# Patient Record
Sex: Female | Born: 1970 | Race: Black or African American | Hispanic: No | Marital: Single | Smoking: Never smoker
Health system: Southern US, Community
[De-identification: ages and names within clinical notes are randomized; demographics above are authoritative.]

## PROBLEM LIST (undated history)

## (undated) DIAGNOSIS — R011 Cardiac murmur, unspecified: Secondary | ICD-10-CM

## (undated) DIAGNOSIS — E041 Nontoxic single thyroid nodule: Secondary | ICD-10-CM

## (undated) DIAGNOSIS — A6000 Herpesviral infection of urogenital system, unspecified: Secondary | ICD-10-CM

## (undated) DIAGNOSIS — G43909 Migraine, unspecified, not intractable, without status migrainosus: Secondary | ICD-10-CM

## (undated) DIAGNOSIS — M1990 Unspecified osteoarthritis, unspecified site: Secondary | ICD-10-CM

## (undated) DIAGNOSIS — I272 Pulmonary hypertension, unspecified: Secondary | ICD-10-CM

## (undated) DIAGNOSIS — B9689 Other specified bacterial agents as the cause of diseases classified elsewhere: Secondary | ICD-10-CM

## (undated) DIAGNOSIS — N760 Acute vaginitis: Secondary | ICD-10-CM

## (undated) HISTORY — DX: Herpesviral infection of urogenital system, unspecified: A60.00

## (undated) HISTORY — DX: Nontoxic single thyroid nodule: E04.1

## (undated) HISTORY — DX: Migraine, unspecified, not intractable, without status migrainosus: G43.909

## (undated) HISTORY — DX: Cardiac murmur, unspecified: R01.1

## (undated) HISTORY — DX: Acute vaginitis: N76.0

## (undated) HISTORY — PX: TONSILLECTOMY: SUR1361

## (undated) HISTORY — DX: Other specified bacterial agents as the cause of diseases classified elsewhere: B96.89

---

## 2009-03-03 ENCOUNTER — Emergency Department (HOSPITAL_BASED_OUTPATIENT_CLINIC_OR_DEPARTMENT_OTHER): Admission: EM | Admit: 2009-03-03 | Discharge: 2009-03-03 | Payer: Self-pay | Admitting: Emergency Medicine

## 2009-03-03 ENCOUNTER — Ambulatory Visit: Payer: Self-pay | Admitting: Diagnostic Radiology

## 2009-05-01 ENCOUNTER — Ambulatory Visit: Payer: Self-pay | Admitting: Diagnostic Radiology

## 2009-05-01 ENCOUNTER — Emergency Department (HOSPITAL_BASED_OUTPATIENT_CLINIC_OR_DEPARTMENT_OTHER): Admission: EM | Admit: 2009-05-01 | Discharge: 2009-05-01 | Payer: Self-pay | Admitting: Emergency Medicine

## 2011-01-23 LAB — CBC
HCT: 40.2 % (ref 36.0–46.0)
Hemoglobin: 13.6 g/dL (ref 12.0–15.0)
RDW: 12.4 % (ref 11.5–15.5)

## 2011-01-23 LAB — DIFFERENTIAL
Basophils Absolute: 0 10*3/uL (ref 0.0–0.1)
Eosinophils Relative: 1 % (ref 0–5)
Lymphocytes Relative: 23 % (ref 12–46)
Lymphs Abs: 2.2 10*3/uL (ref 0.7–4.0)
Monocytes Absolute: 0.5 10*3/uL (ref 0.1–1.0)

## 2011-01-23 LAB — BASIC METABOLIC PANEL
GFR calc non Af Amer: >60 mL/min (ref >60)
Glucose, Bld: 77 mg/dL (ref 70–99)
Potassium: 4 mEq/L (ref 3.5–5.1)
Sodium: 142 mEq/L (ref 135–145)

## 2011-01-23 LAB — POCT CARDIAC MARKERS
CKMB, poc: <1 ng/mL — ABNORMAL LOW (ref 1.0–8.0)
Myoglobin, poc: 43 ng/mL (ref 12–200)
Troponin i, poc: <0.05 ng/mL (ref 0.00–0.09)
Troponin i, poc: <0.05 ng/mL (ref 0.00–0.09)

## 2013-03-31 ENCOUNTER — Ambulatory Visit (INDEPENDENT_AMBULATORY_CARE_PROVIDER_SITE_OTHER): Payer: Private Health Insurance - Indemnity | Admitting: Family Medicine

## 2013-03-31 ENCOUNTER — Encounter: Payer: Self-pay | Admitting: Family Medicine

## 2013-03-31 VITALS — BP 119/77 | HR 75 | Ht 68.25 in | Wt 157.0 lb

## 2013-03-31 DIAGNOSIS — A6000 Herpesviral infection of urogenital system, unspecified: Secondary | ICD-10-CM

## 2013-03-31 DIAGNOSIS — Z30011 Encounter for initial prescription of contraceptive pills: Secondary | ICD-10-CM

## 2013-03-31 DIAGNOSIS — Z0000 Encounter for general adult medical examination without abnormal findings: Secondary | ICD-10-CM

## 2013-03-31 DIAGNOSIS — Z124 Encounter for screening for malignant neoplasm of cervix: Secondary | ICD-10-CM

## 2013-03-31 DIAGNOSIS — Z3009 Encounter for other general counseling and advice on contraception: Secondary | ICD-10-CM

## 2013-03-31 LAB — POCT URINALYSIS DIPSTICK
Bilirubin, UA: NEGATIVE
Glucose, UA: NEGATIVE
Ketones, UA: NEGATIVE
Leukocytes, UA: NEGATIVE
Nitrite, UA: NEGATIVE
Protein, UA: NEGATIVE
Spec Grav, UA: 1.025
Urobilinogen, UA: NEGATIVE
pH, UA: 5

## 2013-03-31 MED ORDER — NORETHINDRONE ACET-ETHINYL EST 1-20 MG-MCG PO TABS: 1.0000 | ORAL_TABLET | Freq: Every day | ORAL | Status: DC

## 2013-03-31 MED ORDER — VALACYCLOVIR HCL 500 MG PO TABS: 500.0000 mg | ORAL_TABLET | Freq: Every day | ORAL | Status: AC

## 2013-03-31 NOTE — Progress Notes (Signed)
Subjective:    Patient ID: Anna Fleming, female    DOB: 10/20/1970, 42 y.o.   MRN: [id number]  HPI  Anna Fleming is here today for her annual CPE with PAP.  She has done well since her last office visit.  She needs to have her oral contraceptive pills refilled.     Review of Systems  Constitutional: Negative for activity change, appetite change, fatigue and unexpected weight change.  HENT: Negative for hearing loss, ear pain, congestion, trouble swallowing, neck pain, dental problem and voice change.   Eyes: Negative for pain, redness and visual disturbance.  Respiratory: Negative for cough and shortness of breath.   Cardiovascular: Negative for chest pain, palpitations and leg swelling.  Gastrointestinal: Negative for nausea, vomiting, abdominal pain, diarrhea, constipation and blood in stool.  Endocrine: Negative for cold intolerance, heat intolerance, polydipsia, polyphagia and polyuria.  Genitourinary: Negative for dysuria, urgency, frequency, hematuria, vaginal discharge and pelvic pain.  Musculoskeletal: Negative for myalgias, back pain, joint swelling and arthralgias.  Skin: Negative for rash.  Neurological: Negative for dizziness, weakness and headaches.  Hematological: Negative for adenopathy. Does not bruise/bleed easily.  Psychiatric/Behavioral: Negative for sleep disturbance, dysphoric mood and decreased concentration. The patient is not nervous/anxious.     Past Medical History  Diagnosis Date  . Migraine headache   . Bacterial vaginosis   . Cardiac murmur   . Thyroid cyst   . Genital herpes    Family History  Problem Relation Age of Onset  . Hypertension Mother   . Cancer Father   . Stroke Paternal Aunt   . Hypertension Paternal Aunt   . Hypertension Maternal Grandmother    History   Social History Narrative   Marital Status: Single    Children:  Daughters (Destiny, Social worker)   Pets:  None    Living Situation: Lives with her daughters.      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 She appears well-developed and well-nourished.  HENT:  Head: Normocephalic and atraumatic.  Right Ear: External ear normal.  Left Ear: External ear normal.  Nose: Nose normal.  Mouth/Throat: Oropharynx is clear and moist.  Eyes: Conjunctivae and EOM are normal. Pupils are equal, round, and reactive to light.  Neck: Normal range of motion. No thyromegaly present.  Cardiovascular: Normal rate, regular rhythm, normal heart sounds and intact distal pulses.  Exam reveals no gallop and no friction rub.   No murmur heard. Pulmonary/Chest: Effort normal and breath sounds normal. Right breast exhibits no inverted nipple, no mass, no nipple discharge, no skin change and no tenderness. Left breast exhibits no inverted nipple, no mass, no nipple discharge, no skin change and no tenderness. Breasts are symmetrical.  Abdominal: Soft. Bowel sounds are normal. Hernia confirmed negative in the right inguinal area and confirmed negative in the left inguinal area.  Genitourinary: Vagina normal and uterus normal. Pelvic exam was performed with patient supine. There is no rash, tenderness or lesion on the right labia. There is no rash, tenderness or lesion on the left labia. No vaginal discharge  found.  Musculoskeletal: Normal range of motion. She exhibits no edema and no tenderness.  Lymphadenopathy:    She has no cervical adenopathy.       Right: No inguinal adenopathy present.       Left: No inguinal adenopathy present.  Neurological: She is alert and oriented to person, place, and time. She has normal reflexes.  Skin: Skin is warm and dry.  Psychiatric: She has a normal mood and affect.  Her behavior is normal. Judgment and thought content normal.          Assessment & Plan:

## 2013-03-31 NOTE — Patient Instructions (Addendum)
Preventive Care for Adults, Female A healthy lifestyle and preventive care can promote health and wellness. Preventive health guidelines for women include the following key practices.  A routine yearly physical is a good way to check with your caregiver about your health and preventive screening. It is a chance to share any concerns and updates on your health, and to receive a thorough exam.  Visit your dentist for a routine exam and preventive care every 6 months. Brush your teeth twice a day and floss once a day. Good oral hygiene prevents tooth decay and gum disease.  The frequency of eye exams is based on your age, health, family medical history, use of contact lenses, and other factors. Follow your caregiver's recommendations for frequency of eye exams.  Eat a healthy diet. Foods like vegetables, fruits, whole grains, low-fat dairy products, and lean protein foods contain the nutrients you need without too many calories. Decrease your intake of foods high in solid fats, added sugars, and salt. Eat the right amount of calories for you.Get information about a proper diet from your caregiver, if necessary.  Regular physical exercise is one of the most important things you can do for your health. Most adults should get at least 150 minutes of moderate-intensity exercise (any activity that increases your heart rate and causes you to sweat) each week. In addition, most adults need muscle-strengthening exercises on 2 or more days a week.  Maintain a healthy weight. The body mass index (BMI) is a screening tool to identify possible weight problems. It provides an estimate of body fat based on height and weight. Your caregiver can help determine your BMI, and can help you achieve or maintain a healthy weight.For adults 20 years and older:  A BMI below 18.5 is considered underweight.  A BMI of 18.5 to 24.9 is normal.  A BMI of 25 to 29.9 is considered overweight.  A BMI of 30 and above is  considered obese.  Maintain normal blood lipids and cholesterol levels by exercising and minimizing your intake of saturated fat. Eat a balanced diet with plenty of fruit and vegetables. Blood tests for lipids and cholesterol should begin at age 20 and be repeated every 5 years. If your lipid or cholesterol levels are high, you are over 50, or you are at high risk for heart disease, you may need your cholesterol levels checked more frequently.Ongoing high lipid and cholesterol levels should be treated with medicines if diet and exercise are not effective.  If you smoke, find out from your caregiver how to quit. If you do not use tobacco, do not start.  If you are pregnant, do not drink alcohol. If you are breastfeeding, be very cautious about drinking alcohol. If you are not pregnant and choose to drink alcohol, do not exceed 1 drink per day. One drink is considered to be 12 ounces (355 mL) of beer, 5 ounces (148 mL) of wine, or 1.5 ounces (44 mL) of liquor.  Avoid use of street drugs. Do not share needles with anyone. Ask for help if you need support or instructions about stopping the use of drugs.  High blood pressure causes heart disease and increases the risk of stroke. Your blood pressure should be checked at least every 1 to 2 years. Ongoing high blood pressure should be treated with medicines if weight loss and exercise are not effective.  If you are 55 to 40 years old, ask your caregiver if you should take aspirin to prevent strokes.  Diabetes   screening involves taking a blood sample to check your fasting blood sugar level. This should be done once every 3 years, after age 45, if you are within normal weight and without risk factors for diabetes. Testing should be considered at a younger age or be carried out more frequently if you are overweight and have at least 1 risk factor for diabetes.  Breast cancer screening is essential preventive care for women. You should practice "breast  self-awareness." This means understanding the normal appearance and feel of your breasts and may include breast self-examination. Any changes detected, no matter how small, should be reported to a caregiver. Women in their 20s and 30s should have a clinical breast exam (CBE) by a caregiver as part of a regular health exam every 1 to 3 years. After age 40, women should have a CBE every year. Starting at age 40, women should consider having a mammography (breast X-ray test) every year. Women who have a family history of breast cancer should talk to their caregiver about genetic screening. Women at a high risk of breast cancer should talk to their caregivers about having magnetic resonance imaging (MRI) and a mammography every year.  The Pap test is a screening test for cervical cancer. A Pap test can show cell changes on the cervix that might become cervical cancer if left untreated. A Pap test is a procedure in which cells are obtained and examined from the lower end of the uterus (cervix).  Women should have a Pap test starting at age 21.  Between ages 21 and 29, Pap tests should be repeated every 2 years.  Beginning at age 30, you should have a Pap test every 3 years as long as the past 3 Pap tests have been normal.  Some women have medical problems that increase the chance of getting cervical cancer. Talk to your caregiver about these problems. It is especially important to talk to your caregiver if a new problem develops soon after your last Pap test. In these cases, your caregiver may recommend more frequent screening and Pap tests.  The above recommendations are the same for women who have or have not gotten the vaccine for human papillomavirus (HPV).  If you had a hysterectomy for a problem that was not cancer or a condition that could lead to cancer, then you no longer need Pap tests. Even if you no longer need a Pap test, a regular exam is a good idea to make sure no other problems are  starting.  If you are between ages 65 and 70, and you have had normal Pap tests going back 10 years, you no longer need Pap tests. Even if you no longer need a Pap test, a regular exam is a good idea to make sure no other problems are starting.  If you have had past treatment for cervical cancer or a condition that could lead to cancer, you need Pap tests and screening for cancer for at least 20 years after your treatment.  If Pap tests have been discontinued, risk factors (such as a new sexual partner) need to be reassessed to determine if screening should be resumed.  The HPV test is an additional test that may be used for cervical cancer screening. The HPV test looks for the virus that can cause the cell changes on the cervix. The cells collected during the Pap test can be tested for HPV. The HPV test could be used to screen women aged 30 years and older, and should   be used in women of any age who have unclear Pap test results. After the age of 30, women should have HPV testing at the same frequency as a Pap test.  Colorectal cancer can be detected and often prevented. Most routine colorectal cancer screening begins at the age of 50 and continues through age 75. However, your caregiver may recommend screening at an earlier age if you have risk factors for colon cancer. On a yearly basis, your caregiver may provide home test kits to check for hidden blood in the stool. Use of a small camera at the end of a tube, to directly examine the colon (sigmoidoscopy or colonoscopy), can detect the earliest forms of colorectal cancer. Talk to your caregiver about this at age 50, when routine screening begins. Direct examination of the colon should be repeated every 5 to 10 years through age 75, unless early forms of pre-cancerous polyps or small growths are found.  Hepatitis C blood testing is recommended for all people born from 1945 through 1965 and any individual with known risks for hepatitis C.  Practice  safe sex. Use condoms and avoid high-risk sexual practices to reduce the spread of sexually transmitted infections (STIs). STIs include gonorrhea, chlamydia, syphilis, trichomonas, herpes, HPV, and human immunodeficiency virus (HIV). Herpes, HIV, and HPV are viral illnesses that have no cure. They can result in disability, cancer, and death. Sexually active women aged 25 and younger should be checked for chlamydia. Older women with new or multiple partners should also be tested for chlamydia. Testing for other STIs is recommended if you are sexually active and at increased risk.  Osteoporosis is a disease in which the bones lose minerals and strength with aging. This can result in serious bone fractures. The risk of osteoporosis can be identified using a bone density scan. Women ages 65 and over and women at risk for fractures or osteoporosis should discuss screening with their caregivers. Ask your caregiver whether you should take a calcium supplement or vitamin D to reduce the rate of osteoporosis.  Menopause can be associated with physical symptoms and risks. Hormone replacement therapy is available to decrease symptoms and risks. You should talk to your caregiver about whether hormone replacement therapy is right for you.  Use sunscreen with sun protection factor (SPF) of 30 or more. Apply sunscreen liberally and repeatedly throughout the day. You should seek shade when your shadow is shorter than you. Protect yourself by wearing long sleeves, pants, a wide-brimmed hat, and sunglasses year round, whenever you are outdoors.  Once a month, do a whole body skin exam, using a mirror to look at the skin on your back. Notify your caregiver of new moles, moles that have irregular borders, moles that are larger than a pencil eraser, or moles that have changed in shape or color.  Stay current with required immunizations.  Influenza. You need a dose every fall (or winter). The composition of the flu vaccine  changes each year, so being vaccinated once is not enough.  Pneumococcal polysaccharide. You need 1 to 2 doses if you smoke cigarettes or if you have certain chronic medical conditions. You need 1 dose at age 65 (or older) if you have never been vaccinated.  Tetanus, diphtheria, pertussis (Tdap, Td). Get 1 dose of Tdap vaccine if you are younger than age 65, are over 65 and have contact with an infant, are a healthcare worker, are pregnant, or simply want to be protected from whooping cough. After that, you need a Td   booster dose every 10 years. Consult your caregiver if you have not had at least 3 tetanus and diphtheria-containing shots sometime in your life or have a deep or dirty wound.  HPV. You need this vaccine if you are a woman age 26 or younger. The vaccine is given in 3 doses over 6 months.  Measles, mumps, rubella (MMR). You need at least 1 dose of MMR if you were born in 1957 or later. You may also need a second dose.  Meningococcal. If you are age 19 to 21 and a first-year college student living in a residence hall, or have one of several medical conditions, you need to get vaccinated against meningococcal disease. You may also need additional booster doses.  Zoster (shingles). If you are age 60 or older, you should get this vaccine.  Varicella (chickenpox). If you have never had chickenpox or you were vaccinated but received only 1 dose, talk to your caregiver to find out if you need this vaccine.  Hepatitis A. You need this vaccine if you have a specific risk factor for hepatitis A virus infection or you simply wish to be protected from this disease. The vaccine is usually given as 2 doses, 6 to 18 months apart.  Hepatitis B. You need this vaccine if you have a specific risk factor for hepatitis B virus infection or you simply wish to be protected from this disease. The vaccine is given in 3 doses, usually over 6 months. Preventive Services / Frequency Ages 19 to 39  Blood  pressure check.** / Every 1 to 2 years.  Lipid and cholesterol check.** / Every 5 years beginning at age 20.  Clinical breast exam.** / Every 3 years for women in their 20s and 30s.  Pap test.** / Every 2 years from ages 21 through 29. Every 3 years starting at age 30 through age 65 or 70 with a history of 3 consecutive normal Pap tests.  HPV screening.** / Every 3 years from ages 30 through ages 65 to 70 with a history of 3 consecutive normal Pap tests.  Hepatitis C blood test.** / For any individual with known risks for hepatitis C.  Skin self-exam. / Monthly.  Influenza immunization.** / Every year.  Pneumococcal polysaccharide immunization.** / 1 to 2 doses if you smoke cigarettes or if you have certain chronic medical conditions.  Tetanus, diphtheria, pertussis (Tdap, Td) immunization. / A one-time dose of Tdap vaccine. After that, you need a Td booster dose every 10 years.  HPV immunization. / 3 doses over 6 months, if you are 26 and younger.  Measles, mumps, rubella (MMR) immunization. / You need at least 1 dose of MMR if you were born in 1957 or later. You may also need a second dose.  Meningococcal immunization. / 1 dose if you are age 19 to 21 and a first-year college student living in a residence hall, or have one of several medical conditions, you need to get vaccinated against meningococcal disease. You may also need additional booster doses.  Varicella immunization.** / Consult your caregiver.  Hepatitis A immunization.** / Consult your caregiver. 2 doses, 6 to 18 months apart.  Hepatitis B immunization.** / Consult your caregiver. 3 doses usually over 6 months. Ages 40 to 64  Blood pressure check.** / Every 1 to 2 years.  Lipid and cholesterol check.** / Every 5 years beginning at age 20.  Clinical breast exam.** / Every year after age 40.  Mammogram.** / Every year beginning at age 40   and continuing for as long as you are in good health. Consult with your  caregiver.  Pap test.** / Every 3 years starting at age 30 through age 65 or 70 with a history of 3 consecutive normal Pap tests.  HPV screening.** / Every 3 years from ages 30 through ages 65 to 70 with a history of 3 consecutive normal Pap tests.  Fecal occult blood test (FOBT) of stool. / Every year beginning at age 50 and continuing until age 75. You may not need to do this test if you get a colonoscopy every 10 years.  Flexible sigmoidoscopy or colonoscopy.** / Every 5 years for a flexible sigmoidoscopy or every 10 years for a colonoscopy beginning at age 50 and continuing until age 75.  Hepatitis C blood test.** / For all people born from 1945 through 1965 and any individual with known risks for hepatitis C.  Skin self-exam. / Monthly.  Influenza immunization.** / Every year.  Pneumococcal polysaccharide immunization.** / 1 to 2 doses if you smoke cigarettes or if you have certain chronic medical conditions.  Tetanus, diphtheria, pertussis (Tdap, Td) immunization.** / A one-time dose of Tdap vaccine. After that, you need a Td booster dose every 10 years.  Measles, mumps, rubella (MMR) immunization. / You need at least 1 dose of MMR if you were born in 1957 or later. You may also need a second dose.  Varicella immunization.** / Consult your caregiver.  Meningococcal immunization.** / Consult your caregiver.  Hepatitis A immunization.** / Consult your caregiver. 2 doses, 6 to 18 months apart.  Hepatitis B immunization.** / Consult your caregiver. 3 doses, usually over 6 months. Ages 65 and over  Blood pressure check.** / Every 1 to 2 years.  Lipid and cholesterol check.** / Every 5 years beginning at age 20.  Clinical breast exam.** / Every year after age 40.  Mammogram.** / Every year beginning at age 40 and continuing for as long as you are in good health. Consult with your caregiver.  Pap test.** / Every 3 years starting at age 30 through age 65 or 70 with a 3  consecutive normal Pap tests. Testing can be stopped between 65 and 70 with 3 consecutive normal Pap tests and no abnormal Pap or HPV tests in the past 10 years.  HPV screening.** / Every 3 years from ages 30 through ages 65 or 70 with a history of 3 consecutive normal Pap tests. Testing can be stopped between 65 and 70 with 3 consecutive normal Pap tests and no abnormal Pap or HPV tests in the past 10 years.  Fecal occult blood test (FOBT) of stool. / Every year beginning at age 50 and continuing until age 75. You may not need to do this test if you get a colonoscopy every 10 years.  Flexible sigmoidoscopy or colonoscopy.** / Every 5 years for a flexible sigmoidoscopy or every 10 years for a colonoscopy beginning at age 50 and continuing until age 75.  Hepatitis C blood test.** / For all people born from 1945 through 1965 and any individual with known risks for hepatitis C.  Osteoporosis screening.** / A one-time screening for women ages 65 and over and women at risk for fractures or osteoporosis.  Skin self-exam. / Monthly.  Influenza immunization.** / Every year.  Pneumococcal polysaccharide immunization.** / 1 dose at age 65 (or older) if you have never been vaccinated.  Tetanus, diphtheria, pertussis (Tdap, Td) immunization. / A one-time dose of Tdap vaccine if you are over   65 and have contact with an infant, are a healthcare worker, or simply want to be protected from whooping cough. After that, you need a Td booster dose every 10 years.  Varicella immunization.** / Consult your caregiver.  Meningococcal immunization.** / Consult your caregiver.  Hepatitis A immunization.** / Consult your caregiver. 2 doses, 6 to 18 months apart.  Hepatitis B immunization.** / Check with your caregiver. 3 doses, usually over 6 months. ** Family history and personal history of risk and conditions may change your caregiver's recommendations. Document Released: 11/27/2001 Document Revised: 12/24/2011  Document Reviewed: 02/26/2011 [employer] Patient Information 2014 [employer].  

## 2013-04-01 ENCOUNTER — Other Ambulatory Visit (HOSPITAL_COMMUNITY)
Admission: RE | Admit: 2013-04-01 | Discharge: 2013-04-01 | Disposition: A | Discharge status: Home / Self Care | Payer: Private Health Insurance - Indemnity | Source: Ambulatory Visit | Attending: Family Medicine | Admitting: Family Medicine

## 2013-04-01 DIAGNOSIS — Z1151 Encounter for screening for human papillomavirus (HPV): Secondary | ICD-10-CM | POA: Insufficient documentation

## 2013-04-01 DIAGNOSIS — Z01419 Encounter for gynecological examination (general) (routine) without abnormal findings: Secondary | ICD-10-CM | POA: Insufficient documentation

## 2013-05-03 DIAGNOSIS — A6000 Herpesviral infection of urogenital system, unspecified: Secondary | ICD-10-CM | POA: Insufficient documentation

## 2013-05-03 DIAGNOSIS — Z30011 Encounter for initial prescription of contraceptive pills: Secondary | ICD-10-CM | POA: Insufficient documentation

## 2013-05-03 DIAGNOSIS — Z0000 Encounter for general adult medical examination without abnormal findings: Secondary | ICD-10-CM | POA: Insufficient documentation

## 2013-05-03 DIAGNOSIS — Z124 Encounter for screening for malignant neoplasm of cervix: Secondary | ICD-10-CM | POA: Insufficient documentation

## 2013-05-03 NOTE — Assessment & Plan Note (Signed)
Refilled her Valtrex 

## 2013-05-03 NOTE — Assessment & Plan Note (Signed)
Refilled her Loestrin 

## 2013-05-03 NOTE — Assessment & Plan Note (Signed)
Pap was obtained without difficulty.  We're checking her for HPV.

## 2013-05-03 NOTE — Assessment & Plan Note (Signed)
Normal exam - we discussed preventative issues for her age.

## 2013-10-02 ENCOUNTER — Encounter: Payer: Self-pay | Admitting: *Deleted

## 2014-03-02 ENCOUNTER — Other Ambulatory Visit: Payer: Self-pay | Admitting: Family Medicine

## 2014-03-03 ENCOUNTER — Other Ambulatory Visit: Payer: Self-pay | Admitting: *Deleted

## 2014-03-03 MED ORDER — NORETHINDRONE ACET-ETHINYL EST 1-20 MG-MCG PO TABS: 1.0000 | ORAL_TABLET | Freq: Every day | ORAL | Status: DC

## 2014-03-03 NOTE — Telephone Encounter (Signed)
Anna Fleming needs a refill on her birth control pills,I made her an appointment to come in on April 05, 2014 for her CPE

## 2014-04-05 ENCOUNTER — Encounter: Payer: Private Health Insurance - Indemnity | Admitting: Family Medicine

## 2014-04-28 ENCOUNTER — Encounter: Payer: Self-pay | Admitting: Family Medicine

## 2014-04-28 ENCOUNTER — Ambulatory Visit (INDEPENDENT_AMBULATORY_CARE_PROVIDER_SITE_OTHER): Payer: Private Health Insurance - Indemnity | Admitting: Family Medicine

## 2014-04-28 VITALS — BP 106/68 | HR 68 | Resp 16 | Ht 68.5 in | Wt 156.0 lb

## 2014-04-28 DIAGNOSIS — Z3041 Encounter for surveillance of contraceptive pills: Secondary | ICD-10-CM

## 2014-04-28 DIAGNOSIS — Z0000 Encounter for general adult medical examination without abnormal findings: Secondary | ICD-10-CM

## 2014-04-28 DIAGNOSIS — B009 Herpesviral infection, unspecified: Secondary | ICD-10-CM

## 2014-04-28 MED ORDER — VALACYCLOVIR HCL 500 MG PO TABS
500.0000 mg | ORAL_TABLET | Freq: Every day | ORAL | Status: AC
Start: 2014-04-28 — End: 2015-04-29

## 2014-04-28 MED ORDER — NORETHINDRONE ACET-ETHINYL EST 1-20 MG-MCG PO TABS: 1.0000 | ORAL_TABLET | Freq: Every day | ORAL | Status: AC

## 2014-04-28 NOTE — Progress Notes (Signed)
Subjective:    Patient ID: Anna Fleming, female    DOB: 07/16/1971, 43 y.o.   MRN: [id number]  HPI  [name] is here today for her annual CPE. She is doing well and has no medical complaints today. She is needing to get her birth control refilled.    Review of Systems  Constitutional: Negative for activity change, appetite change and fatigue.  Cardiovascular: Negative for chest pain, palpitations and leg swelling.  Psychiatric/Behavioral: The patient is not nervous/anxious.   All other systems reviewed and are negative.    Past Medical History  Diagnosis Date  . Migraine headache   . Bacterial vaginosis   . Cardiac murmur   . Thyroid cyst   . Genital herpes      History reviewed. No pertinent past surgical history.   History   Social History Narrative   Marital Status: Single    Children:  Daughters (Destiny, Sales executive)   Pets:  None    Living Situation: Lives with her daughters.      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 Hypertension Mother   . Cancer Father   . Stroke Paternal Aunt   . Hypertension Paternal Aunt   . Hypertension Maternal Grandmother      Current Outpatient Prescriptions on File Prior to Visit  Medication Sig Dispense Refill  . Etanercept (ENBREL) 25 MG/0.5ML SOLN Inject into the skin.      . hydroxychloroquine (PLAQUENIL) 200 MG tablet Take 200 mg by mouth daily.       No current facility-administered medications on file prior to visit.     No Known Allergies   Immunization History  Administered Date(s) Administered  . Pneumococcal Conjugate-13 07/14/2009  . Tdap 01/24/2007       Objective:   Physical Exam  Vitals reviewed. Constitutional: She is oriented to  person, place, and time. She appears well-developed and well-nourished.  HENT:  Head: Normocephalic and atraumatic.  Right Ear: External ear normal.  Left Ear: External ear normal.  Nose: Nose normal.  Mouth/Throat: Oropharynx is clear and moist.  Eyes: Conjunctivae and EOM are normal. Pupils are equal, round, and reactive to light.  Neck: Normal range of motion. No thyromegaly present.  Cardiovascular: Normal rate, regular rhythm, normal heart sounds and intact distal pulses.  Exam reveals no gallop and no friction rub.   No murmur heard. Pulmonary/Chest: Effort normal and breath sounds normal. Right breast exhibits no inverted nipple, no mass, no nipple discharge, no skin change and no tenderness. Left breast exhibits no inverted nipple, no mass, no nipple discharge, no skin change and no tenderness. Breasts are symmetrical.  Abdominal: Soft. Bowel sounds are normal. Hernia confirmed negative in the right inguinal area and confirmed negative in the left inguinal area.  Genitourinary: Vagina normal and uterus normal. Pelvic exam was performed with patient supine. There is no rash, tenderness or lesion on the right labia. There is no rash, tenderness or lesion on the left labia. No vaginal discharge found.  Musculoskeletal: Normal range of motion. She exhibits no edema and no tenderness.  Lymphadenopathy:    She has no cervical adenopathy.       Right: No inguinal adenopathy present.       Left: No inguinal adenopathy present.  Neurological: She is alert and oriented to person, place, and time. She has normal reflexes.  Skin: Skin is warm and dry.  Psychiatric: She has a normal mood and affect. Her behavior is normal. Judgment and thought content normal.      Assessment & Plan:    [name] was seen today for annual exam.  Diagnoses and associated orders for this visit:  Routine general medical examination at a health care facility  Oral contraceptive pill  surveillance - norethindrone-ethinyl estradiol (LOESTRIN 1/20, 21,) 1-20 MG-MCG tablet; Take 1 tablet by mouth daily.  Herpes infection - valACYclovir (VALTREX) 500 MG tablet; Take 1 tablet (500 mg total) by mouth daily.

## 2016-10-31 ENCOUNTER — Ambulatory Visit: Payer: Private Health Insurance - Indemnity | Admitting: Obstetrics & Gynecology

## 2016-11-02 ENCOUNTER — Telehealth: Payer: Self-pay

## 2016-11-02 NOTE — Telephone Encounter (Signed)
Attempted to rescheduled patient appointment and work number listed is disconnected and home number rings busy. Kathrene Alu RNBSN

## 2016-11-23 ENCOUNTER — Ambulatory Visit (INDEPENDENT_AMBULATORY_CARE_PROVIDER_SITE_OTHER): Payer: BLUE CROSS/BLUE SHIELD | Admitting: Obstetrics & Gynecology

## 2016-11-23 ENCOUNTER — Encounter: Payer: Self-pay | Admitting: Obstetrics & Gynecology

## 2016-11-23 VITALS — BP 129/81 | HR 85 | Ht 69.0 in | Wt 164.0 lb

## 2016-11-23 DIAGNOSIS — M069 Rheumatoid arthritis, unspecified: Secondary | ICD-10-CM | POA: Diagnosis not present

## 2016-11-23 DIAGNOSIS — R102 Pelvic and perineal pain: Secondary | ICD-10-CM | POA: Diagnosis not present

## 2016-11-23 DIAGNOSIS — N83202 Unspecified ovarian cyst, left side: Secondary | ICD-10-CM

## 2016-11-23 NOTE — Patient Instructions (Signed)
Ovarian Cyst  An ovarian cyst is a fluid-filled sac that forms on an ovary. The ovaries are small organs that produce eggs in women. Various types of cysts can form on the ovaries. Some may cause symptoms and require treatment. Most ovarian cysts go away on their own, are not cancerous (are benign), and do not cause problems. Common types of ovarian cysts include:  Functional (follicle) cysts.  Occur during the menstrual cycle, and usually go away with the next menstrual cycle if you do not get pregnant.  Usually cause no symptoms.  Endometriomas.  Are cysts that form from the tissue that lines the uterus (endometrium).  Are sometimes called "chocolate cysts" because they become filled with blood that turns brown.  Can cause pain in the lower abdomen during intercourse and during your period.  Cystadenoma cysts.  Develop from cells on the outside surface of the ovary.  Can get very large and cause lower abdomen pain and pain with intercourse.  Can cause severe pain if they twist or break open (rupture).  Dermoid cysts.  Are sometimes found in both ovaries.  May contain different kinds of body tissue, such as skin, teeth, hair, or cartilage.  Usually do not cause symptoms unless they get very big.  Theca lutein cysts.  Occur when too much of a certain hormone (human chorionic gonadotropin) is produced and overstimulates the ovaries to produce an egg.  Are most common after having procedures used to assist with the conception of a baby (in vitro fertilization). What are the causes? Ovarian cysts may be caused by:  Ovarian hyperstimulation syndrome. This is a condition that can develop from taking fertility medicines. It causes multiple large ovarian cysts to form.  Polycystic ovarian syndrome (PCOS). This is a common hormonal disorder that can cause ovarian cysts, as well as problems with your period or fertility. What increases the risk? The following factors may make you  more likely to develop ovarian cysts:  Being overweight or obese.  Taking fertility medicines.  Taking certain forms of hormonal birth control.  Smoking. What are the signs or symptoms? Many ovarian cysts do not cause symptoms. If symptoms are present, they may include:  Pelvic pain or pressure.  Pain in the lower abdomen.  Pain during sex.  Abdominal swelling.  Abnormal menstrual periods.  Increasing pain with menstrual periods. How is this diagnosed? These cysts are commonly found during a routine pelvic exam. You may have tests to find out more about the cyst, such as:  Ultrasound.  X-ray of the pelvis.  CT scan.  MRI.  Blood tests. How is this treated? Many ovarian cysts go away on their own without treatment. Your health care provider may want to check your cyst regularly for 2-3 months to see if it changes. If you are in menopause, it is especially important to have your cyst monitored closely because menopausal women have a higher rate of ovarian cancer. When treatment is needed, it may include:  Medicines to help relieve pain.  A procedure to drain the cyst (aspiration).  Surgery to remove the whole cyst.  Hormone treatment or birth control pills. These methods are sometimes used to help dissolve a cyst. Follow these instructions at home:  Take over-the-counter and prescription medicines only as told by your health care provider.  Do not drive or use heavy machinery while taking prescription pain medicine.  Get regular pelvic exams and Pap tests as often as told by your health care provider.  Return to your   normal activities as told by your health care provider. Ask your health care provider what activities are safe for you.  Do not use any products that contain nicotine or tobacco, such as cigarettes and e-cigarettes. If you need help quitting, ask your health care provider.  Keep all follow-up visits as told by your health care provider. This is  important. Contact a health care provider if:  Your periods are late, irregular, or painful, or they stop.  You have pelvic pain that does not go away.  You have pressure on your bladder or trouble emptying your bladder completely.  You have pain during sex.  You have any of the following in your abdomen:  A feeling of fullness.  Pressure.  Discomfort.  Pain that does not go away.  Swelling.  You feel generally ill.  You become constipated.  You lose your appetite.  You develop severe acne.  You start to have more body hair and facial hair.  You are gaining weight or losing weight without changing your exercise and eating habits.  You think you may be pregnant. Get help right away if:  You have abdominal pain that is severe or gets worse.  You cannot eat or drink without vomiting.  You suddenly develop a fever.  Your menstrual period is much heavier than usual. This information is not intended to replace advice given to you by your health care provider. Make sure you discuss any questions you have with your health care provider. Document Released: 10/01/2005 Document Revised: 04/20/2016 Document Reviewed: 03/04/2016 Elsevier Interactive Patient Education  2017 Elsevier Inc.  

## 2016-11-23 NOTE — Progress Notes (Addendum)
History:  46 y.o. G3P2100 here today for eval of ov cyst noted on CT.  Pt is s/p colonoscopy and CT to eval constipation and abd pain.  Pt had a PAP in Mar 2017 which was WNL. Pt reports a dull pain in her pelvis that is intermittent.  Pt has a h/o chronic constipation.   Pain does not limit activity. Pt denies unexplained weight loss.  Pt reports reg cycles on OCPs  12/14/2015: [facility] PAP neg with neg hrHPV  The following portions of the patient's history were reviewed and updated as appropriate: allergies, current medications, past family history, past medical history, past social history, past surgical history and problem list.  Past Medical History:  Diagnosis Date  . Bacterial vaginosis   . Cardiac murmur   . Genital herpes   . Migraine headache   . Thyroid cyst   Rheumatoid arthritis   Review of Systems:  Pertinent items are noted in HPI.   Objective:  Physical Exam Blood pressure 129/81, pulse 85, height 5\' 9"  (1.753 m), weight 164 lb (74.4 kg). Gen: NAD Lungs: CTA CV: RRR Abd: Soft, nontender and nondistended Pelvic: Normal appearing external genitalia.  Normal discharge.  Small uterus, no other palpable masses left ov SLIGHTLY enlarged but, mobile, no uterine or adnexal tenderness  Labs and Imaging 10/16/2016 CT Simple 2.8 ov cyst noted on CT  Assessment & Plan:  pelvic pain- mild given the size of the ov cyst on CT this does not appear to be the etiology of the pelvic pain Left ov cyst ?complex vs simple Chronic constipation  Pelvic US to further characterize left ov cyst and assess other causes of the pelvic pain. F/u in 2-3 months for Annual or sooner prn  Bishoy Cupp L. Harraway-Smith, M.D., Cherlynn June

## 2016-12-26 ENCOUNTER — Ambulatory Visit (HOSPITAL_BASED_OUTPATIENT_CLINIC_OR_DEPARTMENT_OTHER): Payer: BLUE CROSS/BLUE SHIELD

## 2016-12-26 ENCOUNTER — Ambulatory Visit (HOSPITAL_BASED_OUTPATIENT_CLINIC_OR_DEPARTMENT_OTHER)
Admission: RE | Admit: 2016-12-26 | Discharge: 2016-12-26 | Disposition: A | Discharge status: Home / Self Care | Payer: BLUE CROSS/BLUE SHIELD | Source: Ambulatory Visit | Attending: Obstetrics & Gynecology | Admitting: Obstetrics & Gynecology

## 2016-12-26 DIAGNOSIS — D259 Leiomyoma of uterus, unspecified: Secondary | ICD-10-CM | POA: Diagnosis not present

## 2016-12-26 DIAGNOSIS — K5909 Other constipation: Secondary | ICD-10-CM | POA: Insufficient documentation

## 2016-12-26 DIAGNOSIS — R102 Pelvic and perineal pain: Secondary | ICD-10-CM | POA: Diagnosis present

## 2016-12-26 DIAGNOSIS — N83202 Unspecified ovarian cyst, left side: Secondary | ICD-10-CM

## 2017-01-07 ENCOUNTER — Telehealth: Payer: Self-pay

## 2017-01-07 NOTE — Telephone Encounter (Signed)
Patient called and made aware of ultrasound results. Patient states understanding and will call for her annual exam next year. Kathrene Alu RNBSN

## 2017-01-07 NOTE — Telephone Encounter (Signed)
-----   Message from Samuel Germany, RN sent at 01/07/2017  2:56 PM EDT ----- This is patient in [facility] office. Can you call her?  Thank you Vaughan Basta, RN [facility] ----- Message ----- From: Lavonia Drafts, MD Sent: 01/07/2017   8:57 AM To: [facility]  Please call pt. She has a VERY small fibroid otherwise her [place] is neg.  Thx, clh-S

## 2017-01-21 ENCOUNTER — Ambulatory Visit: Payer: BLUE CROSS/BLUE SHIELD | Admitting: Obstetrics & Gynecology

## 2018-03-22 ENCOUNTER — Other Ambulatory Visit: Payer: Self-pay

## 2018-03-22 ENCOUNTER — Emergency Department (HOSPITAL_BASED_OUTPATIENT_CLINIC_OR_DEPARTMENT_OTHER)
Admission: EM | Admit: 2018-03-22 | Discharge: 2018-03-22 | Disposition: A | Discharge status: Home / Self Care | Payer: BLUE CROSS/BLUE SHIELD | Attending: Emergency Medicine | Admitting: Emergency Medicine

## 2018-03-22 ENCOUNTER — Encounter (HOSPITAL_BASED_OUTPATIENT_CLINIC_OR_DEPARTMENT_OTHER): Payer: Self-pay | Admitting: Emergency Medicine

## 2018-03-22 DIAGNOSIS — R110 Nausea: Secondary | ICD-10-CM | POA: Diagnosis present

## 2018-03-22 DIAGNOSIS — E876 Hypokalemia: Secondary | ICD-10-CM

## 2018-03-22 DIAGNOSIS — Z87440 Personal history of urinary (tract) infections: Secondary | ICD-10-CM | POA: Insufficient documentation

## 2018-03-22 DIAGNOSIS — Z79899 Other long term (current) drug therapy: Secondary | ICD-10-CM | POA: Insufficient documentation

## 2018-03-22 DIAGNOSIS — Z3202 Encounter for pregnancy test, result negative: Secondary | ICD-10-CM | POA: Insufficient documentation

## 2018-03-22 LAB — BASIC METABOLIC PANEL
Anion gap: 7 (ref 5–15)
Anion gap: 5 (ref 5–15)
BUN: 8 mg/dL (ref 6–20)
BUN: 7 mg/dL (ref 6–20)
CALCIUM: 8.8 mg/dL — AB (ref 8.9–10.3)
CALCIUM: 7.8 mg/dL — AB (ref 8.9–10.3)
CHLORIDE: 117 mmol/L — AB (ref 101–111)
CO2: 23 mmol/L (ref 22–32)
CO2: 20 mmol/L — ABNORMAL LOW (ref 22–32)
CREATININE: 1.19 mg/dL — AB (ref 0.44–1.00)
CREATININE: 1.09 mg/dL — AB (ref 0.44–1.00)
Chloride: 110 mmol/L (ref 101–111)
GFR calc non Af Amer: 54 mL/min — ABNORMAL LOW (ref >60)
Glucose, Bld: 87 mg/dL (ref 65–99)
Glucose, Bld: 82 mg/dL (ref 65–99)
Potassium: 3.3 mmol/L — ABNORMAL LOW (ref 3.5–5.1)
Potassium: 3.6 mmol/L (ref 3.5–5.1)
SODIUM: 140 mmol/L (ref 135–145)
SODIUM: 142 mmol/L (ref 135–145)

## 2018-03-22 LAB — URINALYSIS, MICROSCOPIC (REFLEX)

## 2018-03-22 LAB — URINALYSIS, ROUTINE W REFLEX MICROSCOPIC
Bilirubin Urine: NEGATIVE
Glucose, UA: NEGATIVE mg/dL
KETONES UR: NEGATIVE mg/dL
NITRITE: NEGATIVE
Protein, ur: NEGATIVE mg/dL
Specific Gravity, Urine: <1.005 — ABNORMAL LOW (ref 1.005–1.030)
pH: 6.5 (ref 5.0–8.0)

## 2018-03-22 LAB — PREGNANCY, URINE: PREG TEST UR: NEGATIVE

## 2018-03-22 LAB — CBC WITH DIFFERENTIAL/PLATELET
BASOS PCT: 0 %
Basophils Absolute: 0 10*3/uL (ref 0.0–0.1)
EOS ABS: 0.1 10*3/uL (ref 0.0–0.7)
Eosinophils Relative: 1 %
HCT: 36.2 % (ref 36.0–46.0)
Hemoglobin: 12.5 g/dL (ref 12.0–15.0)
Lymphocytes Relative: 28 %
Lymphs Abs: 2.5 10*3/uL (ref 0.7–4.0)
MCH: 29.6 pg (ref 26.0–34.0)
MCHC: 34.5 g/dL (ref 30.0–36.0)
MCV: 85.6 fL (ref 78.0–100.0)
MONOS PCT: 6 %
Monocytes Absolute: 0.5 10*3/uL (ref 0.1–1.0)
NEUTROS PCT: 65 %
Neutro Abs: 5.8 10*3/uL (ref 1.7–7.7)
PLATELETS: 306 10*3/uL (ref 150–400)
RBC: 4.23 MIL/uL (ref 3.87–5.11)
RDW: 13.1 % (ref 11.5–15.5)
WBC: 8.9 10*3/uL (ref 4.0–10.5)

## 2018-03-22 MED ORDER — POTASSIUM CHLORIDE CRYS ER 20 MEQ PO TBCR
40.0000 meq | EXTENDED_RELEASE_TABLET | Freq: Once | ORAL | Status: AC
  Administered 2018-03-22: 40 meq via ORAL
  Filled 2018-03-22: qty 2

## 2018-03-22 MED ORDER — KETOROLAC TROMETHAMINE 30 MG/ML IJ SOLN
30.0000 mg | Freq: Once | INTRAMUSCULAR | Status: AC
  Administered 2018-03-22: 30 mg via INTRAVENOUS
  Filled 2018-03-22: qty 1

## 2018-03-22 MED ORDER — SODIUM CHLORIDE 0.9 % IV BOLUS
1000.0000 mL | Freq: Once | INTRAVENOUS | Status: AC
  Administered 2018-03-22: 1000 mL via INTRAVENOUS

## 2018-03-22 MED ORDER — MELOXICAM 15 MG PO TABS: 15.0000 mg | ORAL_TABLET | Freq: Every day | ORAL | 0 refills | Status: AC

## 2018-03-22 NOTE — ED Triage Notes (Signed)
Patient states that she thinks that she is dehydrated because she has been on antibiotics since last Monday for a UTI  - the patient also states that she ate at [employer] and someone else in her family got sick. Since then she has had nausea  - denies any vomiting.

## 2018-03-22 NOTE — ED Provider Notes (Signed)
[facility] Provider Note   CSN: [id number] Arrival date & time: 03/22/18  1516     History   Chief Complaint Chief Complaint  Patient presents with  . Nausea    HPI Anna Fleming is a 47 y.o. female with a past medical history of migraines, who presents to ED for evaluation of nausea for the past 2 weeks.  She feels that she is dehydrated.  She ate a meal at [employer] last week and began having "food poisoning" symptoms since then.  Her coworker had similar symptoms.  For the patient, she has had persistent nausea with no vomiting.  She feels that her mouth is very dry.  She has been on an antibiotic for a UTI since last Monday.  She saw her PCP yesterday and was prescribed Phenergan and Zofran.  She has been taking these with improvement in her nausea.  However, she continues to feel "dehydrated" and feeling like the liquids that she drinks just "passed right through me."  She denies any abdominal pain, fever, diarrhea, constipation, dysuria, recent travel, chest pain or shortness of breath.  HPI  Past Medical History:  Diagnosis Date  . Bacterial vaginosis   . Cardiac murmur   . Genital herpes   . Migraine headache   . Thyroid cyst     Patient Active Problem List   Diagnosis Date Noted  . Rheumatoid arthritis ([place]) 11/23/2016  . Routine general medical examination at a health care facility 05/03/2013  . Screening for malignant neoplasm of the cervix 05/03/2013  . Genital herpes 05/03/2013  . Oral contraceptive prescribed 05/03/2013    History reviewed. No pertinent surgical history.   OB History    Gravida  3   Para  3   Term  2   Preterm  1   AB      Living        SAB      TAB      Ectopic      Multiple      Live Births  3            Home Medications    Prior to Admission medications   Medication Sig Start Date End Date Taking? Authorizing Provider  Etanercept (ENBREL) 25 MG/0.5ML SOLN Inject into the skin.     [provider]  fluocinonide ointment (LIDEX) 0.05 %  03/24/15   [provider]  hydroxychloroquine (PLAQUENIL) 200 MG tablet Take 200 mg by mouth daily.    [provider]  meloxicam (MOBIC) 15 MG tablet Take 1 tablet (15 mg total) by mouth daily. 03/22/18   Tressia Labrum, PA-C  norethindrone-ethinyl estradiol (LOESTRIN 1/20, 21,) 1-20 MG-MCG tablet Take 1 tablet by mouth daily. 04/28/14 04/28/15  Jonathon Resides, MD  norethindrone-ethinyl estradiol (MICROGESTIN,JUNEL,LOESTRIN) 1-20 MG-MCG tablet Take by mouth.    [provider]  SUMAtriptan (IMITREX) 100 MG tablet  10/30/16   [provider]  valACYclovir (VALTREX) 1000 MG tablet Take 1,000 mg by mouth. 09/14/16   [provider]  Vitamin D, Ergocalciferol, (DRISDOL) 50000 units CAPS capsule  08/26/16   [provider]    Family History Family History  Problem Relation Age of Onset  . Hypertension Mother   . Cancer Father   . Hypertension Maternal Grandmother   . Stroke Paternal Aunt   . Hypertension Paternal Aunt     Social History Social History   Tobacco Use  . Smoking status: Never Smoker  .  Smokeless tobacco: Never Used  Substance Use Topics  . Alcohol use: Yes  . Drug use: No     Allergies   Patient has no known allergies.   Review of Systems Review of Systems  Constitutional: Positive for fatigue. Negative for appetite change, chills and fever.  HENT: Negative for ear pain, rhinorrhea, sneezing and sore throat.   Eyes: Negative for photophobia and visual disturbance.  Respiratory: Negative for cough, chest tightness, shortness of breath and wheezing.   Cardiovascular: Negative for chest pain and palpitations.  Gastrointestinal: Positive for nausea. Negative for abdominal pain, blood in stool, constipation, diarrhea and vomiting.  Genitourinary: Negative for dysuria, hematuria and urgency.  Musculoskeletal: Negative for myalgias.  Skin: Negative for rash.   Neurological: Negative for dizziness, weakness and light-headedness.     Physical Exam Updated Vital Signs BP 133/83   Pulse 83   Temp 98.6 F (37 C)   Resp 18   Ht 5\' 9"  (1.753 m)   Wt 69.9 kg (154 lb)   LMP 02/19/2018   SpO2 100%   BMI 22.74 kg/m   Physical Exam  Constitutional: She appears well-developed and well-nourished. No distress.  HENT:  Head: Normocephalic and atraumatic.  Nose: Nose normal.  Eyes: Conjunctivae and EOM are normal. Right eye exhibits no discharge. Left eye exhibits no discharge. No scleral icterus.  Neck: Normal range of motion. Neck supple.  Cardiovascular: Normal rate, regular rhythm, normal heart sounds and intact distal pulses. Exam reveals no gallop and no friction rub.  No murmur heard. Pulmonary/Chest: Effort normal and breath sounds normal. No respiratory distress.  Abdominal: Soft. Bowel sounds are normal. She exhibits no distension. There is no tenderness. There is no guarding.  Musculoskeletal: Normal range of motion. She exhibits no edema.  Neurological: She is alert. She exhibits normal muscle tone. Coordination normal.  Skin: Skin is warm and dry. No rash noted.  Psychiatric: She has a normal mood and affect.  Nursing note and vitals reviewed.    ED Treatments / Results  Labs (all labs ordered are listed, but only abnormal results are displayed) Labs Reviewed  URINALYSIS, ROUTINE W REFLEX MICROSCOPIC - Abnormal; Notable for the following components:      Result Value   Color, Urine STRAW (*)    APPearance CLOUDY (*)    Specific Gravity, Urine <1.005 (*)    Hgb urine dipstick LARGE (*)    Leukocytes, UA LARGE (*)    All other components within normal limits  BASIC METABOLIC PANEL - Abnormal; Notable for the following components:   Potassium 3.3 (*)    Creatinine, Ser 1.19 (*)    Calcium 8.8 (*)    GFR calc non Af Amer 54 (*)    All other components within normal limits  URINALYSIS, MICROSCOPIC (REFLEX) - Abnormal;  Notable for the following components:   Bacteria, UA MANY (*)    All other components within normal limits  BASIC METABOLIC PANEL - Abnormal; Notable for the following components:   Chloride 117 (*)    CO2 20 (*)    Creatinine, Ser 1.09 (*)    Calcium 7.8 (*)    All other components within normal limits  URINE CULTURE  PREGNANCY, URINE  CBC WITH DIFFERENTIAL/PLATELET    EKG None  Radiology No results found.  Procedures Procedures (including critical care time)  Medications Ordered in ED Medications  sodium chloride 0.9 % bolus 1,000 mL (0 mLs Intravenous Stopped 03/22/18 1656)  sodium chloride 0.9 % bolus 1,000 mL (  0 mLs Intravenous Stopped 03/22/18 1754)  potassium chloride SA (K-DUR,KLOR-CON) CR tablet 40 mEq (40 mEq Oral Given 03/22/18 1655)  ketorolac (TORADOL) 30 MG/ML injection 30 mg (30 mg Intravenous Given 03/22/18 1751)     Initial Impression / Assessment and Plan / ED Course  I have reviewed the triage vital signs and the nursing notes.  Pertinent labs & imaging results that were available during my care of the patient were reviewed by me and considered in my medical decision making (see chart for details).     47 year old female presents to ED for evaluation of nausea for the past 2 weeks. She feels like she is dehydrated. Symptoms began 2 weeks ago after she ate a meal at [employer]. Sick contacts with similar symptoms. She feels like she has no appetite and her mouth is very dry. She saw her PCP last week and was given Bactrim for UTI. She took 4 doses of this and d/c it because she felt like it caused a reaction in her abdomen. She was seen by PCP yesterday and given antiemetics. She reports improvement in nausea with this but still has no appetite. On physical exam she is overall well appearing. She has no abdominal TTP. She is afebrile. Labwork shows mild elevation in Cr to 1.4.  Mild hypokalemia at 3.3.  CBC unremarkable.  Urinalysis with bacteria and leukocytes but  does not appear to be a good sample.  Will send for culture.  Patient given 2 L of fluids with improvement in her creatinine and potassium.  She is able to tolerate p.o. intake without difficulty here.  Suspect symptoms are viral in nature.  Vital signs have improved.  We will ask her to continue antiemetics as directed by PCP and to slowly advance her diet.  NSAIDs as needed for myalgias.  I suggested clear broths or protein shakes to help with nutrients.  Advised to return to ED for any severe worsening symptoms.  Portions of this note were generated with [employer]. Dictation errors may occur despite best attempts at proofreading.   Final Clinical Impressions(s) / ED Diagnoses   Final diagnoses:  Hypokalemia  Nausea without vomiting    ED Discharge Orders        Ordered    meloxicam (MOBIC) 15 MG tablet  Daily     03/22/18 1846       Delia Heady, PA-C 03/22/18 1855    Margette Fast, MD 03/23/18 [phone]

## 2018-03-24 LAB — URINE CULTURE: Culture: <10000 — AB

## 2021-04-21 ENCOUNTER — Encounter (HOSPITAL_BASED_OUTPATIENT_CLINIC_OR_DEPARTMENT_OTHER): Payer: Self-pay | Admitting: Emergency Medicine

## 2021-04-21 ENCOUNTER — Emergency Department (HOSPITAL_BASED_OUTPATIENT_CLINIC_OR_DEPARTMENT_OTHER): Payer: BC Managed Care – PPO

## 2021-04-21 ENCOUNTER — Emergency Department (HOSPITAL_BASED_OUTPATIENT_CLINIC_OR_DEPARTMENT_OTHER)
Admission: EM | Admit: 2021-04-21 | Discharge: 2021-04-22 | Disposition: A | Discharge status: Home / Self Care | Payer: BC Managed Care – PPO | Attending: Emergency Medicine | Admitting: Emergency Medicine

## 2021-04-21 DIAGNOSIS — I10 Essential (primary) hypertension: Secondary | ICD-10-CM | POA: Diagnosis not present

## 2021-04-21 DIAGNOSIS — R0789 Other chest pain: Secondary | ICD-10-CM | POA: Diagnosis not present

## 2021-04-21 DIAGNOSIS — R079 Chest pain, unspecified: Secondary | ICD-10-CM | POA: Diagnosis present

## 2021-04-21 DIAGNOSIS — R0981 Nasal congestion: Secondary | ICD-10-CM | POA: Insufficient documentation

## 2021-04-21 DIAGNOSIS — R059 Cough, unspecified: Secondary | ICD-10-CM | POA: Diagnosis not present

## 2021-04-21 HISTORY — DX: Pulmonary hypertension, unspecified: I27.20

## 2021-04-21 HISTORY — DX: Unspecified osteoarthritis, unspecified site: M19.90

## 2021-04-21 LAB — PREGNANCY, URINE: Preg Test, Ur: NEGATIVE

## 2021-04-21 LAB — BASIC METABOLIC PANEL
Anion gap: 5 (ref 5–15)
BUN: 11 mg/dL (ref 6–20)
CO2: 28 mmol/L (ref 22–32)
Calcium: 8.9 mg/dL (ref 8.9–10.3)
Chloride: 106 mmol/L (ref 98–111)
Creatinine, Ser: 1.06 mg/dL — ABNORMAL HIGH (ref 0.44–1.00)
GFR, Estimated: >60 mL/min (ref >60)
Glucose, Bld: 87 mg/dL (ref 70–99)
Potassium: 3.4 mmol/L — ABNORMAL LOW (ref 3.5–5.1)
Sodium: 139 mmol/L (ref 135–145)

## 2021-04-21 LAB — CBC WITH DIFFERENTIAL/PLATELET
Abs Immature Granulocytes: 0.01 10*3/uL (ref 0.00–0.07)
Basophils Absolute: 0 10*3/uL (ref 0.0–0.1)
Basophils Relative: 0 %
Eosinophils Absolute: 0.2 10*3/uL (ref 0.0–0.5)
Eosinophils Relative: 3 %
HCT: 34.9 % — ABNORMAL LOW (ref 36.0–46.0)
Hemoglobin: 11.6 g/dL — ABNORMAL LOW (ref 12.0–15.0)
Immature Granulocytes: 0 %
Lymphocytes Relative: 40 %
Lymphs Abs: 2.9 10*3/uL (ref 0.7–4.0)
MCH: 30.1 pg (ref 26.0–34.0)
MCHC: 33.2 g/dL (ref 30.0–36.0)
MCV: 90.4 fL (ref 80.0–100.0)
Monocytes Absolute: 0.6 10*3/uL (ref 0.1–1.0)
Monocytes Relative: 8 %
Neutro Abs: 3.5 10*3/uL (ref 1.7–7.7)
Neutrophils Relative %: 49 %
Platelets: 282 10*3/uL (ref 150–400)
RBC: 3.86 MIL/uL — ABNORMAL LOW (ref 3.87–5.11)
RDW: 13.5 % (ref 11.5–15.5)
WBC: 7.2 10*3/uL (ref 4.0–10.5)
nRBC: 0 % (ref 0.0–0.2)

## 2021-04-21 LAB — D-DIMER, QUANTITATIVE: D-Dimer, Quant: 0.94 ug/mL-FEU — ABNORMAL HIGH (ref 0.00–0.50)

## 2021-04-21 LAB — TROPONIN I (HIGH SENSITIVITY): Troponin I (High Sensitivity): 2 ng/L (ref <18)

## 2021-04-21 MED ORDER — IOHEXOL 350 MG/ML SOLN
100.0000 mL | Freq: Once | INTRAVENOUS | Status: AC | PRN
  Administered 2021-04-21: 100 mL via INTRAVENOUS

## 2021-04-21 NOTE — ED Notes (Signed)
4  weeks ago Pt. Started with a cough.  Pt. con't to cough off and on and has been see by PMD for cough as well.  Cough went away then came back and worse when it came back.

## 2021-04-21 NOTE — ED Triage Notes (Signed)
Pt states has cough X2 weeks  now chest pain upper left side when coughing. Has HX of pulmonary hypertension.

## 2021-04-21 NOTE — ED Provider Notes (Signed)
[facility] Provider Note   CSN: [id number] Arrival date & time: 04/21/21  1934     History Chief Complaint  Patient presents with   Chest Pain   Cough    Anna Fleming is a 50 y.o. female.  50 year old female with past medical history below including pulmonary hypertension, migraines, rheumatoid arthritis who presents with cough and chest pain.  2 weeks ago, patient developed a dry cough associated with nasal congestion.  She was evaluated at urgent care and PCP and given several cough medications.  She still has the cough, does not feel like it is worsening, but states that over the past 2 days she has been having sharp intermittent chest pain.  The pain initially was just hurting when she coughs but now today she has been having episodes outside of coughing.  Pain only lasts a few seconds and is not associated with exertion.  She denies any fevers, shortness of breath, leg swelling/pain, history of blood clots, history of cancer, or estrogen use.  The history is provided by the patient.  Chest Pain Associated symptoms: cough   Cough Associated symptoms: chest pain       Past Medical History:  Diagnosis Date   Arthritis    Bacterial vaginosis    Cardiac murmur    Genital herpes    Migraine headache    Pulmonary hypertension (HCC)    Thyroid cyst     Patient Active Problem List   Diagnosis Date Noted   Rheumatoid arthritis ([place]) 11/23/2016   Routine general medical examination at a health care facility 05/03/2013   Screening for malignant neoplasm of the cervix 05/03/2013   Genital herpes 05/03/2013   Oral contraceptive prescribed 05/03/2013    Past Surgical History:  Procedure Laterality Date   TONSILLECTOMY       OB History     Gravida  3   Para  3   Term  2   Preterm  1   AB      Living         SAB      IAB      Ectopic      Multiple      Live Births  3           Family History  Problem Relation Age  of Onset   Hypertension Mother    Cancer Father    Hypertension Maternal Grandmother    Stroke Paternal Aunt    Hypertension Paternal Aunt     Social History   Tobacco Use   Smoking status: Never   Smokeless tobacco: Never  Vaping Use   Vaping Use: Never used  Substance Use Topics   Alcohol use: Yes   Drug use: No    Home Medications Prior to Admission medications   Medication Sig Start Date End Date Taking? Authorizing Provider  Etanercept (ENBREL) 25 MG/0.5ML SOLN Inject into the skin.    [provider]  fluocinonide ointment (LIDEX) 0.05 %  03/24/15   [provider]  hydroxychloroquine (PLAQUENIL) 200 MG tablet Take 200 mg by mouth daily.    [provider]  meloxicam (MOBIC) 15 MG tablet Take 1 tablet (15 mg total) by mouth daily. 03/22/18   Khatri, Hina, PA-C  norethindrone-ethinyl estradiol (LOESTRIN 1/20, 21,) 1-20 MG-MCG tablet Take 1 tablet by mouth daily. 04/28/14 04/28/15  Jonathon Resides, MD  norethindrone-ethinyl estradiol (MICROGESTIN,JUNEL,LOESTRIN) 1-20 MG-MCG tablet Take by mouth.    [provider]  SUMAtriptan (IMITREX) 100 MG tablet  10/30/16   [provider]  valACYclovir (VALTREX) 1000 MG tablet Take 1,000 mg by mouth. 09/14/16   [provider]  Vitamin D, Ergocalciferol, (DRISDOL) 50000 units CAPS capsule  08/26/16   [provider]    Allergies    Patient has no known allergies.  Review of Systems   Review of Systems  Respiratory:  Positive for cough.   Cardiovascular:  Positive for chest pain.  All other systems reviewed and are negative except that which was mentioned in HPI  Physical Exam Updated Vital Signs BP (!) 134/91   Pulse 88   Temp 98.5 F (36.9 C) (Oral)   Resp 18   Ht 5\' 9"  (1.753 m)   Wt 76.2 kg   LMP 03/27/2021 (Approximate)   SpO2 100%   BMI 24.81 kg/m   Physical Exam Constitutional:      General: She is not in acute distress.    Appearance: Normal appearance.   HENT:     Head: Normocephalic and atraumatic.  Eyes:     Conjunctiva/sclera: Conjunctivae normal.  Cardiovascular:     Rate and Rhythm: Normal rate and regular rhythm.     Heart sounds: Murmur heard.  Pulmonary:     Effort: Pulmonary effort is normal.     Breath sounds: Normal breath sounds.     Comments: Occasional dry cough Abdominal:     General: Abdomen is flat. Bowel sounds are normal. There is no distension.     Palpations: Abdomen is soft.     Tenderness: There is no abdominal tenderness.  Musculoskeletal:     Right lower leg: No edema.     Left lower leg: No edema.  Skin:    General: Skin is warm and dry.  Neurological:     Mental Status: She is alert and oriented to person, place, and time.     Comments: fluent  Psychiatric:        Mood and Affect: Mood normal.        Behavior: Behavior normal.    ED Results / Procedures / Treatments   Labs (all labs ordered are listed, but only abnormal results are displayed) Labs Reviewed  BASIC METABOLIC PANEL - Abnormal; Notable for the following components:      Result Value   Potassium 3.4 (*)    Creatinine, Ser 1.06 (*)    All other components within normal limits  CBC WITH DIFFERENTIAL/PLATELET - Abnormal; Notable for the following components:   RBC 3.86 (*)    Hemoglobin 11.6 (*)    HCT 34.9 (*)    All other components within normal limits  D-DIMER, QUANTITATIVE - Abnormal; Notable for the following components:   D-Dimer, Quant 0.94 (*)    All other components within normal limits  PREGNANCY, URINE  TROPONIN I (HIGH SENSITIVITY)    EKG EKG Interpretation  Date/Time:  Friday April 21 2021 19:42:02 EDT Ventricular Rate:  95 PR Interval:  142 QRS Duration: 80 QT Interval:  372 QTC Calculation: 467 R Axis:   -26 Text Interpretation: Normal sinus rhythm Normal ECG EKG WITHIN NORMAL LIMITS Confirmed by Theotis Burrow [phone]) on 04/21/2021 9:41:58 PM  Radiology DG Chest 2 View  Result Date: 04/21/2021 CLINICAL  DATA:  Two weeks of cough EXAM: CHEST - 2 VIEW COMPARISON:  12/04/2018 FINDINGS: Cardiac shadow is within normal limits. Fullness in the hila is noted stable in appearance from the prior exam consistent with primary pulmonary hypertension. These changes are unchanged  over multiple previous exams dating back to 2019. No focal infiltrate or sizable effusion is noted. No acute bony abnormality is noted. IMPRESSION: Changes consistent with pulmonary hypertension. No acute abnormality is noted. Electronically Signed   By: Inez Catalina M.D.   On: 04/21/2021 21:47    Procedures Procedures   Medications Ordered in ED Medications - No data to display  ED Course  I have reviewed the triage vital signs and the nursing notes.  Pertinent labs & imaging results that were available during my care of the patient were reviewed by me and considered in my medical decision making (see chart for details).    MDM Rules/Calculators/A&P                          Occasional cough on exam but otherwise well-appearing, reassuring vital signs, normal O2 saturation on room air.  No wheezing.  Chest x-ray with chronic findings but no acute changes.  Lab work shows normal WBC count, negative troponin.  Given sharp chest pain, obtained D-dimer which was elevated therefore I have ordered a CTA of the chest to evaluate for PE.  Patient signed out to oncoming provider pending imaging results.  If CTA is reassuring, anticipate discharge as patient's description of pain is not suggestive of ACS and EKG is normal. Final Clinical Impression(s) / ED Diagnoses Final diagnoses:  None    Rx / DC Orders ED Discharge Orders     None        Jrue Yambao, Wenda Overland, MD 04/21/21 2247

## 2023-02-10 ENCOUNTER — Emergency Department (HOSPITAL_BASED_OUTPATIENT_CLINIC_OR_DEPARTMENT_OTHER)
Admission: EM | Admit: 2023-02-10 | Discharge: 2023-02-10 | Disposition: A | Discharge status: Home / Self Care | Payer: BC Managed Care – PPO | Attending: Emergency Medicine | Admitting: Emergency Medicine

## 2023-02-10 ENCOUNTER — Other Ambulatory Visit: Payer: Self-pay

## 2023-02-10 ENCOUNTER — Encounter (HOSPITAL_BASED_OUTPATIENT_CLINIC_OR_DEPARTMENT_OTHER): Payer: Self-pay | Admitting: Emergency Medicine

## 2023-02-10 ENCOUNTER — Emergency Department (HOSPITAL_BASED_OUTPATIENT_CLINIC_OR_DEPARTMENT_OTHER): Payer: BC Managed Care – PPO

## 2023-02-10 DIAGNOSIS — R051 Acute cough: Secondary | ICD-10-CM | POA: Insufficient documentation

## 2023-02-10 DIAGNOSIS — R052 Subacute cough: Secondary | ICD-10-CM | POA: Diagnosis not present

## 2023-02-10 DIAGNOSIS — Z1152 Encounter for screening for COVID-19: Secondary | ICD-10-CM | POA: Diagnosis not present

## 2023-02-10 DIAGNOSIS — R059 Cough, unspecified: Secondary | ICD-10-CM | POA: Diagnosis present

## 2023-02-10 LAB — SARS CORONAVIRUS 2 BY RT PCR: SARS Coronavirus 2 by RT PCR: NEGATIVE

## 2023-02-10 MED ORDER — HYDROCODONE BIT-HOMATROP MBR 5-1.5 MG/5ML PO SOLN: 5.0000 mL | Freq: Four times a day (QID) | ORAL | 0 refills | Status: AC | PRN

## 2023-02-10 NOTE — ED Triage Notes (Signed)
Patient reports, " I have this cough that is keeping me up at night." Cough has been ongoing for 3 weeks and associated with nasal congestion.

## 2023-02-10 NOTE — ED Provider Notes (Signed)
[place] [facility] Provider Note   CSN: [id number] Arrival date & time: 02/10/23  1016     History  Chief Complaint  Patient presents with   Cough    Anna Fleming is a 52 y.o. female with medical history of pulmonary hypertension, cardiac murmur, arthritis.  Patient presents to ED for evaluation of cough, congestion.  Patient reports that the symptoms been ongoing for the last 3 weeks.  Patient states that she has been congested, has taken Mucinex and Robitussin as well as Alka-Seltzer with slight relief.  Patient reports symptoms always return.  Patient reports she went to her PCP 1 week ago who gave her an inhaler and some kind of spray she is unable to tell the name of.  Patient reports that these did not resolve her cough.  Patient reports that her PCP advised her she might have seasonal allergies however she denies any history of seasonal allergies.  Patient has been using Afrin for the last 1 week without relief.  She has also taken 1 dose of Claritin which did not help.  The patient denies any shortness of breath or chest pain.  Patient denies fevers, body aches or chills.  Patient denies any ear pain, sore throat.    Cough Associated symptoms: rhinorrhea        Home Medications Prior to Admission medications   Medication Sig Start Date End Date Taking? Authorizing Provider  HYDROcodone bit-homatropine (HYCODAN) 5-1.5 MG/5ML syrup Take 5 mLs by mouth every 6 (six) hours as needed for cough. 02/10/23  Yes Al Decant, PA-C  Etanercept (ENBREL) 25 MG/0.5ML SOLN Inject into the skin.    [provider]  fluocinonide ointment (LIDEX) 0.05 %  03/24/15   [provider]  hydroxychloroquine (PLAQUENIL) 200 MG tablet Take 200 mg by mouth daily.    [provider]  meloxicam (MOBIC) 15 MG tablet Take 1 tablet (15 mg total) by mouth daily. 03/22/18   Khatri, Hina, PA-C  norethindrone-ethinyl estradiol (LOESTRIN 1/20,  21,) 1-20 MG-MCG tablet Take 1 tablet by mouth daily. 04/28/14 04/28/15  Gillian Scarce, MD  norethindrone-ethinyl estradiol (MICROGESTIN,JUNEL,LOESTRIN) 1-20 MG-MCG tablet Take by mouth.    [provider]  SUMAtriptan (IMITREX) 100 MG tablet  10/30/16   [provider]  valACYclovir (VALTREX) 1000 MG tablet Take 1,000 mg by mouth. 09/14/16   [provider]  Vitamin D, Ergocalciferol, (DRISDOL) 50000 units CAPS capsule  08/26/16   [provider]      Allergies    Sulfamethoxazole-trimethoprim    Review of Systems   Review of Systems  HENT:  Positive for congestion and rhinorrhea.   Respiratory:  Positive for cough.   All other systems reviewed and are negative.   Physical Exam Updated Vital Signs BP 120/84 (BP Location: Left Arm)   Pulse 78   Temp 98.2 F (36.8 C) (Oral)   Resp 20   Ht 5\' 9"  (1.753 m)   Wt 78.9 kg   LMP 02/03/2023 (Exact Date)   SpO2 97%   BMI 25.70 kg/m  Physical Exam Vitals and nursing note reviewed.  Constitutional:      General: She is not in acute distress.    Appearance: She is well-developed.  HENT:     Head: Normocephalic and atraumatic.     Mouth/Throat:     Pharynx: No oropharyngeal exudate or posterior oropharyngeal erythema.  Eyes:     Conjunctiva/sclera: Conjunctivae normal.  Cardiovascular:  Rate and Rhythm: Normal rate and regular rhythm.     Heart sounds: No murmur heard. Pulmonary:     Effort: Pulmonary effort is normal. No respiratory distress.     Breath sounds: Normal breath sounds. No wheezing.  Abdominal:     Palpations: Abdomen is soft.     Tenderness: There is no abdominal tenderness.  Musculoskeletal:        General: No swelling.     Cervical back: Neck supple. No tenderness.  Skin:    General: Skin is warm and dry.     Capillary Refill: Capillary refill takes less than 2 seconds.  Neurological:     Mental Status: She is alert and oriented to person, place, and time.   Psychiatric:        Mood and Affect: Mood normal.     ED Results / Procedures / Treatments   Labs (all labs ordered are listed, but only abnormal results are displayed) Labs Reviewed  SARS CORONAVIRUS 2 BY RT PCR    EKG None  Radiology DG Chest 2 View  Result Date: 02/10/2023 CLINICAL DATA:  Cough for 3 weeks. EXAM: CHEST - 2 VIEW COMPARISON:  04/21/2021 FINDINGS: Normal heart size. Unchanged enlargement of bilateral pulmonary arteries compatible with PA hypertension. No pleural fluid or edema. No airspace consolidation identified. Visualized osseous structures are unremarkable. IMPRESSION: 1. No acute findings. 2. Unchanged enlargement of bilateral pulmonary arteries compatible with PA hypertension. Electronically Signed   By: Signa Kell M.D.   On: 02/10/2023 11:50    Procedures Procedures   Medications Ordered in ED Medications - No data to display  ED Course/ Medical Decision Making/ A&P  Medical Decision Making Amount and/or Complexity of Data Reviewed Radiology: ordered.   52 year old female presents to ED for evaluation.  Please see HPI for further details.  On examination patient is afebrile and nontachycardic.  Her lung sounds are clear bilaterally, she is not hypoxic.  Her abdomen is soft and compressible throughout.  Her posterior oropharynx is not erythematous, there is no exudate.  Her uvula is midline.  The patient viral testing is negative.  The patient chest x-ray shows no consolidations or effusions.  Patient advised to begin taking Claritin-D.  Patient will be sent home with Hycodan syrup as she states that her cough is worse at night and keeps her up.  The patient was advised to only take this medication at night before bed.  Patient was advised that she cannot drive or operate heavy machinery under the influence this medication.  The patient was initially offered Jerilynn Som however she reports that she was prescribed these by her PCP and they did  not work.  The patient will be advised to follow-up with her PCP for further management if her cough persists.  The patient was given return precautions and she voiced understanding.  The patient had all of her questions answered to her satisfaction.  Stable to discharge home.   Final Clinical Impression(s) / ED Diagnoses Final diagnoses:  Acute cough  Subacute cough    Rx / DC Orders ED Discharge Orders          Ordered    HYDROcodone bit-homatropine (HYCODAN) 5-1.5 MG/5ML syrup  Every 6 hours PRN        02/10/23 1205              Al Decant, PA-C 02/10/23 1206    Ernie Avena, MD 02/10/23 2041

## 2023-02-10 NOTE — Discharge Instructions (Addendum)
Return to the ED with any new or worsening symptoms such as fevers Please follow-up with PCP for further management of her cough. Please begin taking Hycodan syrup prescribed you.  Please take this at night before bed.  Please be aware that this will cause sedation.  Please do not drive or operate heavy machinery while using this medication. Please continue with sinus rinses.  Please begin taking Claritin D. Please read attached guide concerning cough

## 2023-02-13 ENCOUNTER — Emergency Department (HOSPITAL_BASED_OUTPATIENT_CLINIC_OR_DEPARTMENT_OTHER)
Admission: EM | Admit: 2023-02-13 | Discharge: 2023-02-13 | Disposition: A | Discharge status: Home / Self Care | Payer: BC Managed Care – PPO | Attending: Emergency Medicine | Admitting: Emergency Medicine

## 2023-02-13 ENCOUNTER — Other Ambulatory Visit: Payer: Self-pay

## 2023-02-13 ENCOUNTER — Emergency Department (HOSPITAL_BASED_OUTPATIENT_CLINIC_OR_DEPARTMENT_OTHER): Payer: BC Managed Care – PPO

## 2023-02-13 DIAGNOSIS — M25512 Pain in left shoulder: Secondary | ICD-10-CM | POA: Diagnosis present

## 2023-02-13 MED ORDER — KETOROLAC TROMETHAMINE 15 MG/ML IJ SOLN
15.0000 mg | Freq: Once | INTRAMUSCULAR | Status: AC
  Administered 2023-02-13: 15 mg via INTRAMUSCULAR
  Filled 2023-02-13: qty 1

## 2023-02-13 NOTE — ED Provider Notes (Signed)
[place] [facility] Provider Note   CSN: [id number] Arrival date & time: 02/13/23  1255     History  Chief Complaint  Patient presents with   Shoulder Pain    Anna Fleming is a 52 y.o. female who presents to the ED with gradually worsening left shoulder pain onset 2 days. Denies recent fall, injury, trauma, fall. Called her rheumatologist to set up a follow-up appointment regarding today's ED visit and is hopeful to get it scheduled for the next 2 days.  No meds tried at home.  Denies sleeping on her shoulder wrong.  Denies chest pain, shortness of breath.  The history is provided by the patient. No language interpreter was used.       Home Medications Prior to Admission medications   Medication Sig Start Date End Date Taking? Authorizing Provider  Etanercept (ENBREL) 25 MG/0.5ML SOLN Inject into the skin.    [provider]  fluocinonide ointment (LIDEX) 0.05 %  03/24/15   [provider]  HYDROcodone bit-homatropine (HYCODAN) 5-1.5 MG/5ML syrup Take 5 mLs by mouth every 6 (six) hours as needed for cough. 02/10/23   Al Decant, PA-C  hydroxychloroquine (PLAQUENIL) 200 MG tablet Take 200 mg by mouth daily.    [provider]  meloxicam (MOBIC) 15 MG tablet Take 1 tablet (15 mg total) by mouth daily. 03/22/18   Khatri, Hina, PA-C  norethindrone-ethinyl estradiol (LOESTRIN 1/20, 21,) 1-20 MG-MCG tablet Take 1 tablet by mouth daily. 04/28/14 04/28/15  Gillian Scarce, MD  norethindrone-ethinyl estradiol (MICROGESTIN,JUNEL,LOESTRIN) 1-20 MG-MCG tablet Take by mouth.    [provider]  SUMAtriptan (IMITREX) 100 MG tablet  10/30/16   [provider]  valACYclovir (VALTREX) 1000 MG tablet Take 1,000 mg by mouth. 09/14/16   [provider]  Vitamin D, Ergocalciferol, (DRISDOL) 50000 units CAPS capsule  08/26/16   [provider]      Allergies    Sulfamethoxazole-trimethoprim     Review of Systems   Review of Systems  All other systems reviewed and are negative.   Physical Exam Updated Vital Signs BP (!) 142/89   Pulse 84   Temp 98.6 F (37 C) (Oral)   Resp 17   Ht 5\' 9"  (1.753 m)   Wt 78.9 kg   LMP 02/03/2023 (Exact Date)   SpO2 100%   BMI 25.70 kg/m  Physical Exam Vitals and nursing note reviewed.  Constitutional:      General: She is not in acute distress.    Appearance: Normal appearance.  Eyes:     General: No scleral icterus.    Extraocular Movements: Extraocular movements intact.  Cardiovascular:     Rate and Rhythm: Normal rate.  Pulmonary:     Effort: Pulmonary effort is normal. No respiratory distress.  Musculoskeletal:     Cervical back: Neck supple.     Comments: Tenderness to palpation to anterior left shoulder. No obvious deformity, effusion, erythema, or swelling. Decreased active ROM of left shoulder secondary to pain. Full passive ROM.   Skin:    General: Skin is warm and dry.     Findings: No bruising, erythema or rash.  Neurological:     Mental Status: She is alert.  Psychiatric:        Behavior: Behavior normal.     ED Results / Procedures / Treatments   Labs (all labs ordered are listed, but only abnormal results are displayed) Labs Reviewed - No data to display  EKG  None  Radiology DG Shoulder Left  Result Date: 02/13/2023 CLINICAL DATA:  Left shoulder pain EXAM: LEFT SHOULDER - 2+ VIEW COMPARISON:  None Available. FINDINGS: There is no evidence of fracture or dislocation. There is no evidence of arthropathy or other focal bone abnormality. Soft tissues are unremarkable. IMPRESSION: Negative. Electronically Signed   By: Judie Petit.  Shick M.D.   On: 02/13/2023 14:38    Procedures Procedures    Medications Ordered in ED Medications  ketorolac (TORADOL) 15 MG/ML injection 15 mg (15 mg Intramuscular Given 02/13/23 1423)    ED Course/ Medical Decision Making/ A&P Clinical Course as of 02/13/23 1524  Wed Feb 13, 2023   1518 Re-evaluated and noted improvement of symptoms with treatment regimen. Discussed discharge treatment plan. Pt agreeable at this time. Pt appears safe for discharge. [SB]    Clinical Course User Index [SB] Jenaro Souder A, PA-C                             Medical Decision Making Amount and/or Complexity of Data Reviewed Radiology: ordered.  Risk Prescription drug management.   Patient with left shoulder pain onset 2 days. Vital signs pt afebrile. On exam, patient with Tenderness to palpation to anterior left shoulder. No obvious deformity, effusion, erythema, or swelling. Decreased active ROM of left shoulder secondary to pain. Full passive ROM. Differential diagnosis includes fracture, dislocation, strain, rotator cuff pathology.    Imaging: I ordered imaging studies including left shoulder xray I independently visualized and interpreted imaging which showed: no acute findings I agree with the radiologist interpretation  Medications:  I ordered medication including toradol for symptom management  Reevaluation of the patient after these medicines and interventions, I reevaluated the patient and found that they have improved I have reviewed the patients home medicines and have made adjustments as needed   Disposition: Presentation suspicious for left shoulder pain, likely rotator cuff pathology. Doubt fracture or dislocation at this time. After consideration of the diagnostic results and the patients response to treatment, I feel that the patient would benefit from Discharge home.  Pt provided with information for on-call ortho for follow up. Supportive care measures and strict return precautions discussed with patient at bedside. Pt acknowledges and verbalizes understanding. Pt appears safe for discharge. Follow up as indicated in discharge paperwork.    This chart was dictated using voice recognition software, Dragon. Despite the best efforts of this provider to proofread and  correct errors, errors may still occur which can change documentation meaning.   Final Clinical Impression(s) / ED Diagnoses Final diagnoses:  Acute pain of left shoulder    Rx / DC Orders ED Discharge Orders     None         Rosia Syme A, PA-C 02/13/23 1524    Ernie Avena, MD 02/13/23 1944

## 2023-02-13 NOTE — ED Triage Notes (Signed)
Patient presents to ED via POV from home. Here with left shoulder pain since Monday. History of arthritis, expresses concerns that it is getting worse. Limited movement to left shoulder. Denies recent injury or trauma.

## 2023-02-13 NOTE — Discharge Instructions (Addendum)
It was a pleasure taking care of you!   Your x-ray was negative for fracture or dislocation.  You may take over the counter 600 mg Ibuprofen every 6 hours and alternate with 500 mg Tylenol every 6 hours as needed for pain for no more than 7 days.  You may apply ice or heat to affected area for up to 15 minutes at a time. Ensure to place a barrier between your skin and the ice/heat. Attached is information for the on-call orthopedist group to set up a follow up appointment regarding todays ED visit. You may follow-up with your primary care provider as needed.  Return to the Emergency Department if you are experiencing increasing/worsening symptoms.

## 2023-03-27 IMAGING — CT CT ANGIO CHEST
3 of 9 series · 18 of 36 positions shown · IV contrast (Omnipaque)
Comparison: CT 10/29/2018

CLINICAL DATA: Cough for 2 weeks, left chest pain, history of
pulmonary hypertension

EXAM:
CT ANGIOGRAPHY CHEST WITH CONTRAST
TECHNIQUE: Multidetector CT imaging of the chest was performed using the
standard protocol during bolus administration of intravenous
contrast. Multiplanar CT image reconstructions and MIPs were
obtained to evaluate the vascular anatomy.
CONTRAST:  100mL OMNIPAQUE IOHEXOL 350 MG/ML SOLN

[Series 5: pe thins · axial · 0.76mm/px · z∈[-321,-25]mm · 14 of 342 slices shown]
[im 23/342  lung]
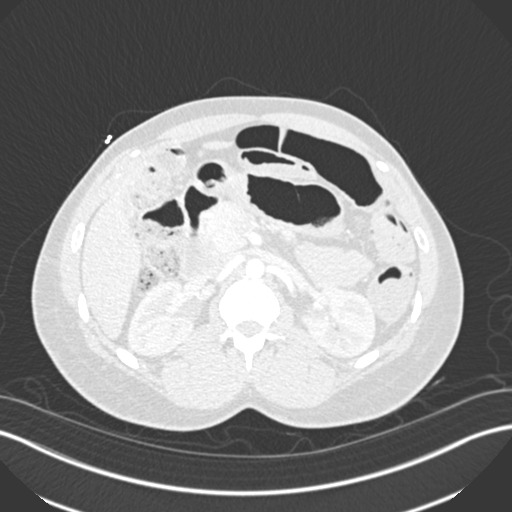
[im 46/342  mediastinal]
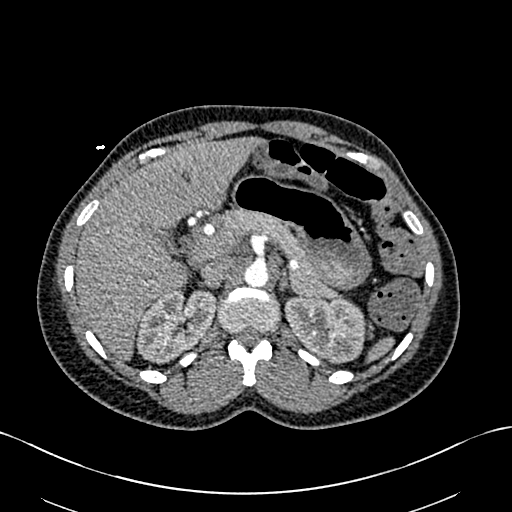
[im 69/342  lung]
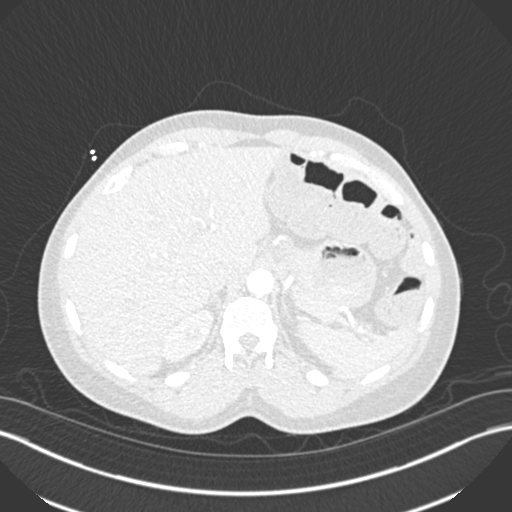
[im 91/342  mediastinal]
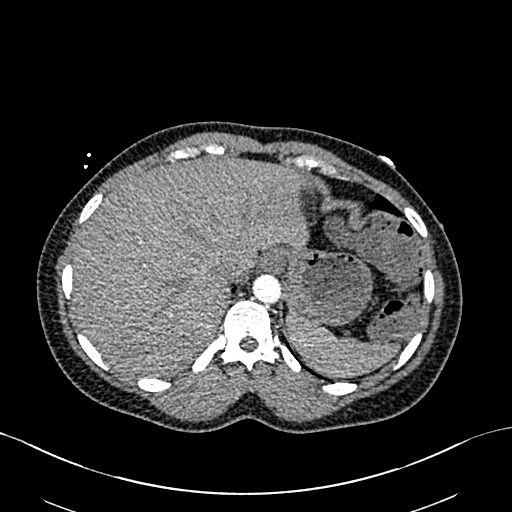
[im 114/342  lung]
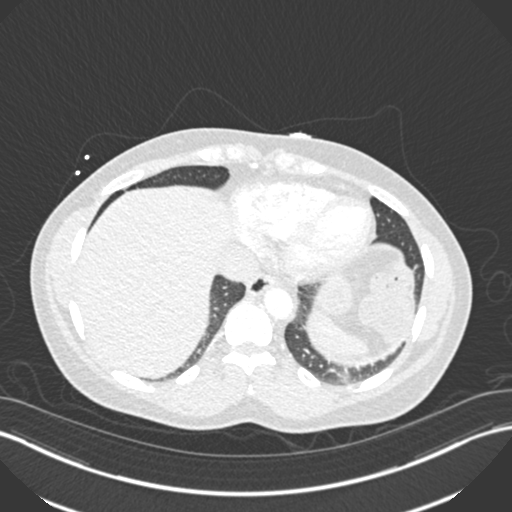
[im 137/342  mediastinal]
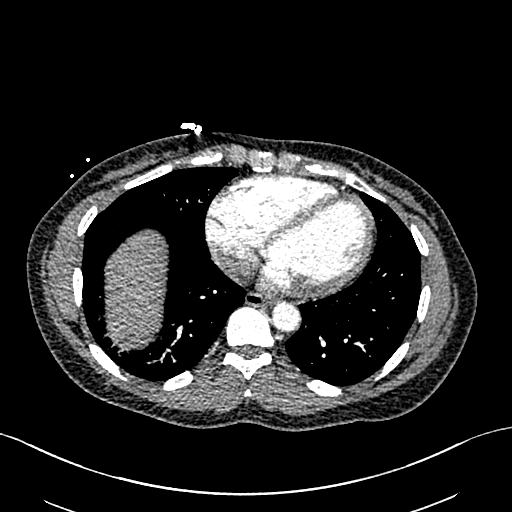
[im 160/342  lung]
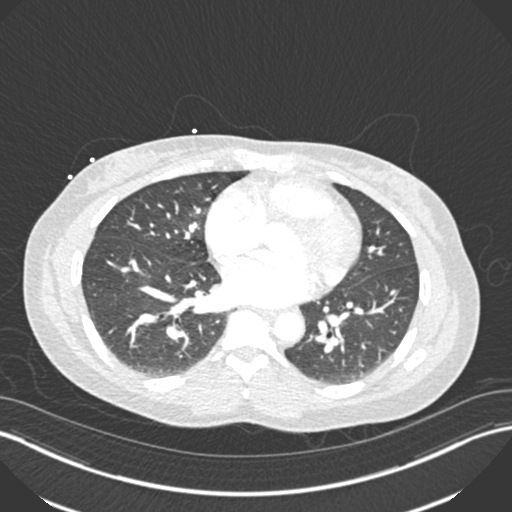
[im 182/342  mediastinal]
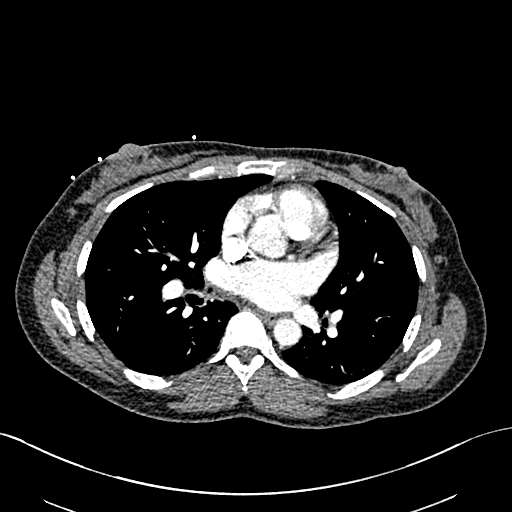
[im 205/342  lung]
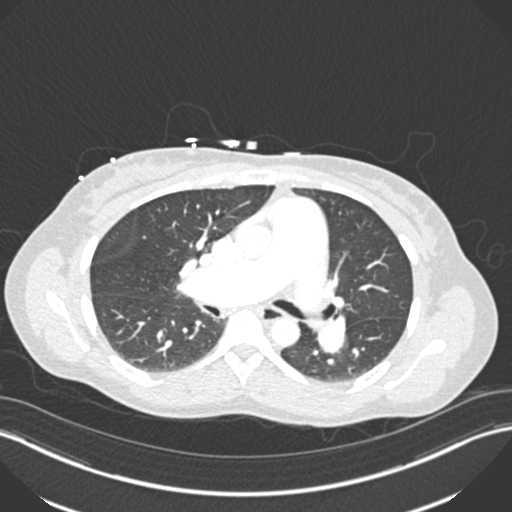
[im 228/342  mediastinal]
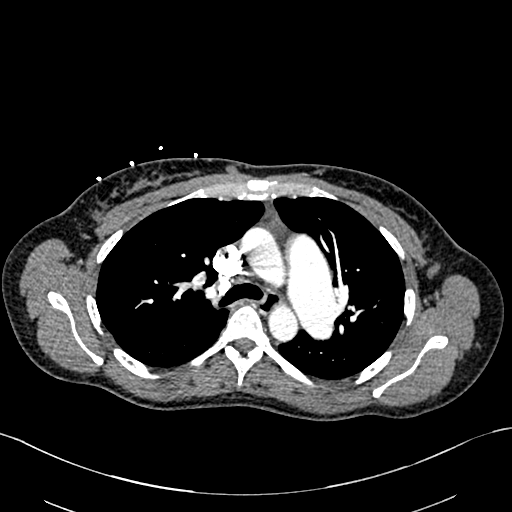
[im 251/342  lung]
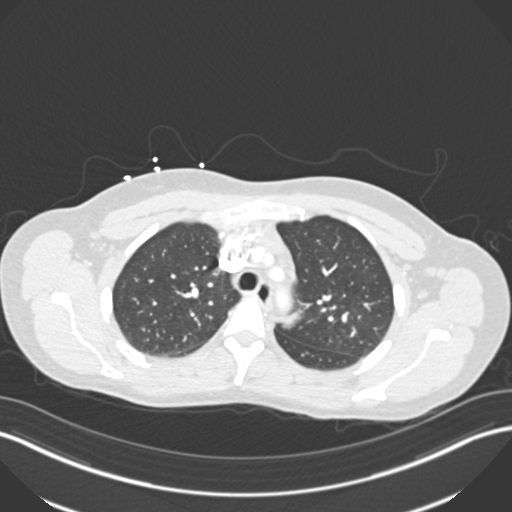
[im 273/342  mediastinal]
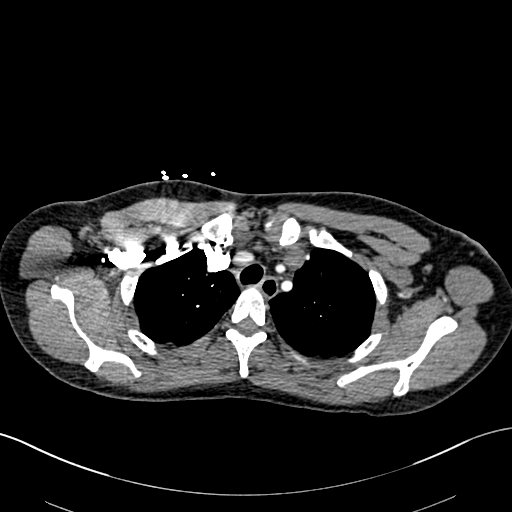
[im 296/342  lung]
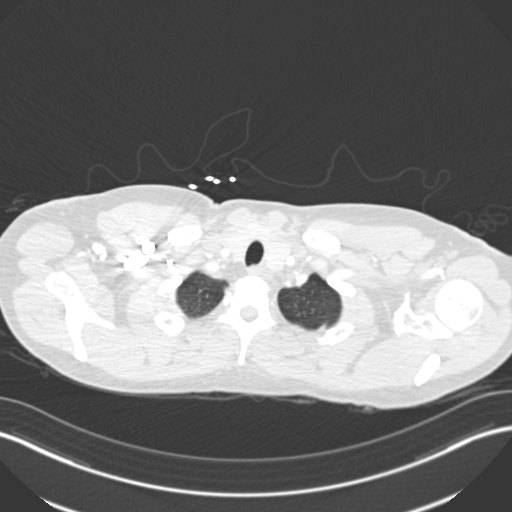
[im 319/342  mediastinal]
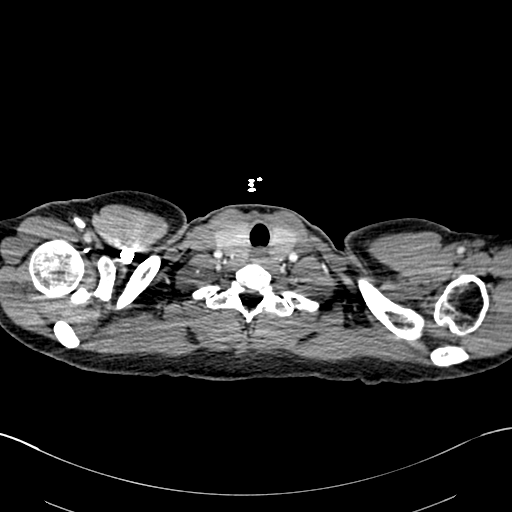

[Series 6: pe lung · axial · 0.92mm/px · z∈[-209,-71]mm · 3 of 93 slices shown]
[im 24/93  mediastinal]
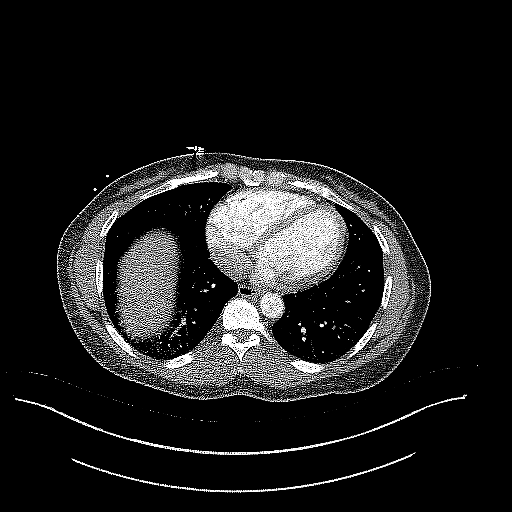
[im 47/93  mediastinal]
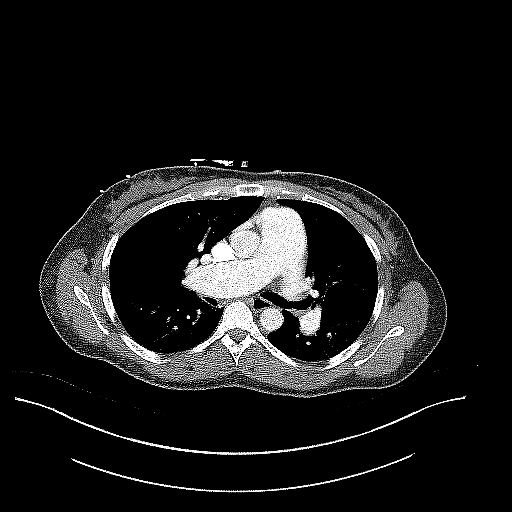
[im 70/93  mediastinal]
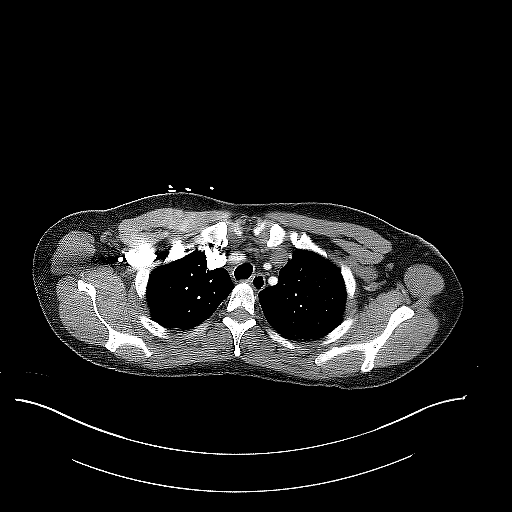

[Series 7: pe coronal mpr · coronal · 0.67mm/px · 1 of 151 slices shown]
[im 76/151  mediastinal]
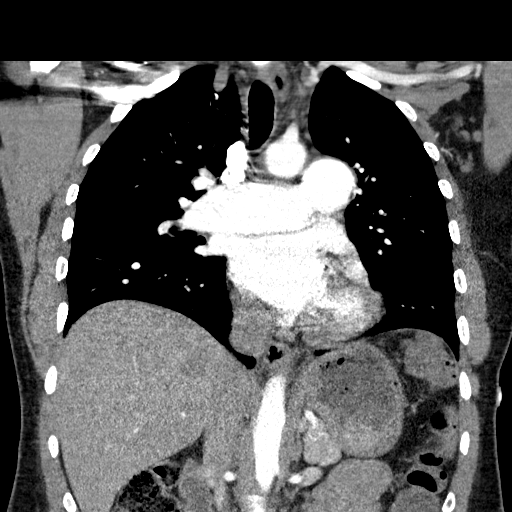

[18 of 36 positions shown; findings below may reference images not displayed]

FINDINGS: Cardiovascular: Satisfactory opacification the pulmonary arteries is
segmental level. No pulmonary arterial filling defects are
identified. Marked central pulmonary artery enlargement is again
noted, unchanged from comparison prior. Pulmonary trunk again
measures to 4.6 cm at the level of the bifurcation. The aortic root
is suboptimally assessed given cardiac pulsation artifact. The aorta
is normal caliber. No acute luminal abnormality of the imaged aorta.
No periaortic stranding or hemorrhage. Normal 3 vessel branching of
the aortic arch. Proximal great vessels are unremarkable. Normal
heart size. No pericardial effusion. No major venous abnormalities.

Mediastinum/Nodes: Some wedge-shaped soft tissue and fatty stippling
in the anterior mediastinum may reflect thymic remnant. No
mediastinal fluid or gas. Normal thyroid gland and thoracic inlet.
No acute abnormality of the trachea or esophagus. No worrisome
mediastinal, hilar or axillary adenopathy.

Lungs/Pleura: No consolidation, features of edema, pneumothorax, or
effusion. No suspicious pulmonary nodules or masses. Minimal
dependent atelectatic changes.

Upper Abdomen: Few small subcentimeter hypoattenuating foci in the
liver too small to fully characterize on CT imaging but
statistically likely benign. No acute abnormalities present in the
visualized portions of the upper abdomen.

Musculoskeletal: No acute osseous abnormality or suspicious osseous
lesion.

Review of the MIP images confirms the above findings.
IMPRESSION: 1. No evidence of pulmonary artery embolism.
2. Central pulmonary artery enlargement compatible with known
arterial hypertension.
3. No other acute or significant intrathoracic processes.
